# Patient Record
Sex: Male | Born: 1975
Health system: Southern US, Community
[De-identification: ages and names within clinical notes are randomized; demographics above are authoritative.]

## PROBLEM LIST (undated history)

## (undated) DIAGNOSIS — R001 Bradycardia, unspecified: Secondary | ICD-10-CM

## (undated) DIAGNOSIS — J45909 Unspecified asthma, uncomplicated: Secondary | ICD-10-CM

## (undated) DIAGNOSIS — F209 Schizophrenia, unspecified: Secondary | ICD-10-CM

## (undated) HISTORY — DX: Unspecified asthma, uncomplicated: J45.909

---

## 2000-06-28 ENCOUNTER — Emergency Department (HOSPITAL_COMMUNITY): Admission: EM | Admit: 2000-06-28 | Discharge: 2000-06-28 | Payer: Self-pay | Admitting: Emergency Medicine

## 2005-03-29 ENCOUNTER — Ambulatory Visit: Payer: Self-pay | Admitting: Family Medicine

## 2009-11-03 ENCOUNTER — Emergency Department (HOSPITAL_COMMUNITY): Admission: EM | Admit: 2009-11-03 | Discharge: 2009-11-03 | Payer: Self-pay | Admitting: Emergency Medicine

## 2009-11-09 ENCOUNTER — Ambulatory Visit (HOSPITAL_COMMUNITY): Admission: RE | Admit: 2009-11-09 | Discharge: 2009-11-09 | Payer: Self-pay | Admitting: Psychiatry

## 2010-10-02 LAB — ETHANOL: Alcohol, Ethyl (B): <5 mg/dL (ref 0–10)

## 2010-10-02 LAB — BASIC METABOLIC PANEL
BUN: 18 mg/dL (ref 6–23)
Creatinine, Ser: 1.01 mg/dL (ref 0.4–1.5)
GFR calc non Af Amer: >60 mL/min (ref >60)
Potassium: 4.2 mEq/L (ref 3.5–5.1)

## 2010-10-02 LAB — CBC
Platelets: 218 10*3/uL (ref 150–400)
WBC: 12.5 10*3/uL — ABNORMAL HIGH (ref 4.0–10.5)

## 2010-10-02 LAB — DIFFERENTIAL
Lymphocytes Relative: 30 % (ref 12–46)
Lymphs Abs: 3.7 10*3/uL (ref 0.7–4.0)
Neutro Abs: 7.6 10*3/uL (ref 1.7–7.7)
Neutrophils Relative %: 61 % (ref 43–77)

## 2010-10-02 LAB — RAPID URINE DRUG SCREEN, HOSP PERFORMED
Benzodiazepines: NOT DETECTED
Cocaine: NOT DETECTED
Tetrahydrocannabinol: NOT DETECTED

## 2010-12-07 ENCOUNTER — Emergency Department (HOSPITAL_COMMUNITY)
Admission: EM | Admit: 2010-12-07 | Discharge: 2010-12-08 | Disposition: A | Discharge status: Other Acute Inpatient Hospital | Payer: Medicaid Other | Attending: Emergency Medicine | Admitting: Emergency Medicine

## 2010-12-07 DIAGNOSIS — F29 Unspecified psychosis not due to a substance or known physiological condition: Secondary | ICD-10-CM | POA: Insufficient documentation

## 2010-12-08 ENCOUNTER — Inpatient Hospital Stay (HOSPITAL_COMMUNITY)
Admission: EM | Admit: 2010-12-08 | Discharge: 2010-12-19 | DRG: 885 | Disposition: A | Discharge status: Home / Self Care | Payer: PRIVATE HEALTH INSURANCE | Source: Other Acute Inpatient Hospital | Attending: Psychiatry | Admitting: Psychiatry

## 2010-12-08 DIAGNOSIS — F259 Schizoaffective disorder, unspecified: Principal | ICD-10-CM | POA: Diagnosis present

## 2010-12-08 DIAGNOSIS — Z5900 Homelessness unspecified: Secondary | ICD-10-CM

## 2010-12-08 DIAGNOSIS — R070 Pain in throat: Secondary | ICD-10-CM | POA: Diagnosis present

## 2010-12-08 LAB — RAPID URINE DRUG SCREEN, HOSP PERFORMED
Amphetamines: NOT DETECTED
Barbiturates: NOT DETECTED
Benzodiazepines: NOT DETECTED

## 2010-12-08 LAB — BASIC METABOLIC PANEL
BUN: 25 mg/dL — ABNORMAL HIGH (ref 6–23)
CO2: 30 mEq/L (ref 19–32)
Calcium: 10.5 mg/dL (ref 8.4–10.5)
Creatinine, Ser: 1.1 mg/dL (ref 0.4–1.5)
GFR calc Af Amer: >60 mL/min (ref >60)

## 2010-12-08 LAB — CBC
Hemoglobin: 14.9 g/dL (ref 13.0–17.0)
MCH: 29.6 pg (ref 26.0–34.0)
MCV: 87.5 fL (ref 78.0–100.0)
RBC: 5.04 MIL/uL (ref 4.22–5.81)

## 2010-12-08 LAB — DIFFERENTIAL
Lymphs Abs: 2.4 10*3/uL (ref 0.7–4.0)
Monocytes Relative: 8 % (ref 3–12)
Neutro Abs: 5.8 10*3/uL (ref 1.7–7.7)
Neutrophils Relative %: 64 % (ref 43–77)

## 2010-12-08 LAB — ETHANOL: Alcohol, Ethyl (B): <11 mg/dL — ABNORMAL HIGH (ref 0–10)

## 2010-12-14 DIAGNOSIS — F259 Schizoaffective disorder, unspecified: Secondary | ICD-10-CM

## 2010-12-17 LAB — COMPREHENSIVE METABOLIC PANEL
ALT: 37 U/L (ref 0–53)
AST: 23 U/L (ref 0–37)
Albumin: 3.9 g/dL (ref 3.5–5.2)
Alkaline Phosphatase: 92 U/L (ref 39–117)
BUN: 10 mg/dL (ref 6–23)
CO2: 31 mEq/L (ref 19–32)
Calcium: 9.7 mg/dL (ref 8.4–10.5)
Chloride: 102 mEq/L (ref 96–112)
Creatinine, Ser: 0.81 mg/dL (ref 0.4–1.5)
GFR calc Af Amer: >60 mL/min (ref >60)
GFR calc non Af Amer: >60 mL/min (ref >60)
Glucose, Bld: 82 mg/dL (ref 70–99)
Potassium: 4.2 mEq/L (ref 3.5–5.1)
Sodium: 140 mEq/L (ref 135–145)
Total Bilirubin: 0.2 mg/dL — ABNORMAL LOW (ref 0.3–1.2)
Total Protein: 6.7 g/dL (ref 6.0–8.3)
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
## 2011-01-01 NOTE — H&P (Signed)
Gregory Jackson, Gregory Jackson NO.:  [id number]  MEDICAL RECORD NO.:  [id number]  LOCATION:  0406                          FACILITY:  BH  PHYSICIAN:  Eulogio Ditch, MD DATE OF BIRTH:  22-Mar-1976  DATE OF ADMISSION:  12/08/2010 DATE OF DISCHARGE:                      PSYCHIATRIC ADMISSION ASSESSMENT   This is an involuntary admission to the services of Dr. Rogers Blocker.  HISTORY OF PRESENT ILLNESS:  The commitment papers indicate that he had told his father he was going to do away with himself.  The father does not think he has been compliant with his meds.  The respondent looked like he had not been eating.  He was dehydrated, and they felt that he was a danger to himself.  Of note, he was just admitted to [facility] May 10 to May 16, although he cannot tell me why.  The discharge papers from [facility] indicate that he was to be seen at [facility] in [place] the following day, on the 17th, and discharge instructions had been given to his mother.  Apparently, he wandered off after that. Today he is minimally communicative  PAST PSYCHIATRIC HISTORY:  According to the information that we have been able to gather to date, he is currently a patient of [facility], and he has had the 1 admission at [facility] that was already commented on.  SOCIAL HISTORY:  He was a high school graduate in 1995.  He has never married.  He has no children.  He is not employed for the past year.  FAMILY HISTORY:  Negative.  ALCOHOL AND DRUG HISTORY:  He denies.  He was positive for opiates in his UDS.  It is unclear whether he just took that off the street or how he got that.  PRIMARY CARE PROVIDER:  He does not have one.  MEDICAL PROBLEMS:  None are known.  MEDICATIONS:  It appears that he is prescribed by Dr. Betti Cruz.  These would have been his discharge meds. 1. Celexa 20 mg p.o. daily. 2. Trazodone 100 mg at h.s. 3. Risperdal 1 mg a.m. and 1.5 mg p.m.  DRUG ALLERGIES:  No  known drug allergies.  POSITIVE PHYSICAL FINDINGS:  He was medically cleared in the ED at [facility].  He was afebrile.  His temperature was 97.3 to 98.4.  His pulse was 66-68.  Respirations were 18-20.  Blood pressure was 108/56 to 116/78.  As already stated, his UDS was positive for opiates.  He had no abnormalities of CBC.  His BUN was slightly elevated at 26.  His UA was within normal limits.  Today, he is complaining about a sore throat.  We will get a culture and sensitivity.  MENTAL STATUS EXAM:  He was drowsy.  He was seen in his room in bed. He, however, could be easily aroused.  He seemed to be oriented.  His speech had normal rate, rhythm and tone.  His mood was depressed.  His affect was congruent.  Thought processes were somewhat clear, rational and goal-oriented.  He thinks he can go stay with his mother at discharge.  Judgment and insight were fair.  Concentration and memory were superficially intact.  Intelligence is average.  He denied being actively  suicidal or homicidal.  He denied any auditory or visual hallucinations.  He stated that because he got surrounded by people, he threatened to hurt himself.  Axis I:  Schizoaffective disorder, depressed type. Axis II:  Deferred. Axis III:  None known. Axis IV:  Severe. AXIS V:  27.  PLAN:  Admit for safety and stabilization.  We will restart his meds for compliance purposes.  We will check with [facility] regarding followup and case management.  We will check into placement.  Estimated length of stay is 3-5 days.     Mickie Leonarda Salon, P.A.-C.   ______________________________ Eulogio Ditch, MD    MD/MEDQ  D:  12/08/2010  T:  12/08/2010  Job:  [id number]  Electronically Signed by Jaci Lazier ADAMS P.A.-C. on 12/27/2010 07:34:38 PM Electronically Signed by Eulogio Ditch  on 01/01/2011 09:55:02 AM

## 2011-01-22 ENCOUNTER — Inpatient Hospital Stay (HOSPITAL_COMMUNITY)
Admission: AD | Admit: 2011-01-22 | Discharge: 2011-02-06 | DRG: 885 | Disposition: A | Discharge status: Home / Self Care | Payer: 59 | Attending: Psychiatry | Admitting: Psychiatry

## 2011-01-22 ENCOUNTER — Emergency Department (HOSPITAL_COMMUNITY)
Admission: EM | Admit: 2011-01-22 | Discharge: 2011-01-22 | Disposition: A | Discharge status: Other Acute Inpatient Hospital | Payer: Medicaid Other | Attending: Emergency Medicine | Admitting: Emergency Medicine

## 2011-01-22 DIAGNOSIS — F29 Unspecified psychosis not due to a substance or known physiological condition: Secondary | ICD-10-CM

## 2011-01-22 DIAGNOSIS — F259 Schizoaffective disorder, unspecified: Principal | ICD-10-CM

## 2011-01-22 DIAGNOSIS — Z9119 Patient's noncompliance with other medical treatment and regimen: Secondary | ICD-10-CM

## 2011-01-22 DIAGNOSIS — Z91199 Patient's noncompliance with other medical treatment and regimen due to unspecified reason: Secondary | ICD-10-CM

## 2011-01-22 HISTORY — DX: Schizophrenia, unspecified: F20.9

## 2011-01-22 LAB — COMPREHENSIVE METABOLIC PANEL
ALT: 21 U/L (ref 0–53)
Alkaline Phosphatase: 105 U/L (ref 39–117)
BUN: 28 mg/dL — ABNORMAL HIGH (ref 6–23)
CO2: 30 mEq/L (ref 19–32)
Chloride: 107 mEq/L (ref 96–112)
Creatinine, Ser: 0.97 mg/dL (ref 0.50–1.35)
GFR calc Af Amer: >60 mL/min (ref >60)
Glucose, Bld: 86 mg/dL (ref 70–99)
Potassium: 4.6 mEq/L (ref 3.5–5.1)
Sodium: 144 mEq/L (ref 135–145)
Total Bilirubin: 0.6 mg/dL (ref 0.3–1.2)
Total Protein: 8 g/dL (ref 6.0–8.3)

## 2011-01-22 LAB — CBC
HCT: 47.8 % (ref 39.0–52.0)
Hemoglobin: 16.6 g/dL (ref 13.0–17.0)
MCHC: 34.7 g/dL (ref 30.0–36.0)
RBC: 5.45 MIL/uL (ref 4.22–5.81)
WBC: 6.9 10*3/uL (ref 4.0–10.5)

## 2011-01-22 LAB — RAPID URINE DRUG SCREEN, HOSP PERFORMED: Barbiturates: NOT DETECTED

## 2011-01-22 LAB — ETHANOL: Alcohol, Ethyl (B): <11 mg/dL (ref 0–11)

## 2011-01-22 MED ORDER — ACETAMINOPHEN 325 MG PO TABS: 650.0000 mg | ORAL_TABLET | ORAL | Status: DC | PRN

## 2011-01-22 MED ORDER — ONDANSETRON HCL 4 MG PO TABS: 4.0000 mg | ORAL_TABLET | Freq: Three times a day (TID) | ORAL | Status: DC | PRN

## 2011-01-22 MED ORDER — LORAZEPAM 1 MG PO TABS: 1.0000 mg | ORAL_TABLET | Freq: Three times a day (TID) | ORAL | Status: DC | PRN

## 2011-01-22 NOTE — ED Notes (Signed)
Sitter at bedside. Pt given blanket. NAD noted at this time.
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
## 2011-01-23 DIAGNOSIS — F200 Paranoid schizophrenia: Secondary | ICD-10-CM

## 2011-01-24 LAB — BASIC METABOLIC PANEL
CO2: 33 mEq/L — ABNORMAL HIGH (ref 19–32)
Calcium: 9.5 mg/dL (ref 8.4–10.5)
Creatinine, Ser: 0.83 mg/dL (ref 0.50–1.35)
GFR calc non Af Amer: >60 mL/min (ref >60)

## 2011-01-24 NOTE — Assessment & Plan Note (Signed)
Gregory Jackson, KIMBERLEY NO.:  [id number]  MEDICAL RECORD NO.:  [id number]  LOCATION:  0402                          FACILITY:  [facility]  PHYSICIAN:  Eulogio Ditch, MD DATE OF BIRTH:  Nov 12, 1975  DATE OF ADMISSION:  01/22/2011 DATE OF DISCHARGE:                      PSYCHIATRIC ADMISSION ASSESSMENT   DATE OF ASSESSMENT:  January 23, 2011, at 1500.  IDENTIFYING INFORMATION:  This is a 35 year old, African American male, single.  This is an involuntary admission.  HISTORY OF THE PRESENT ILLNESS:  This is the second or third [facility] admission for Gregory Jackson, a pleasant African American male with a history of psychosis who had wandered away from his father's home and had been missing for at least a day and was found in the next county sleeping on a park bench.  He was brought to the emergency room by law enforcement. He was cooperative.  He had been off medications for an unknown period of time.  He was placed under petition by his father who was concerned about his safety.  PAST PSYCHIATRIC HISTORY:  History of previous [facility] admission from May 26th to June 6th, 2012, and was discharged at that time on Risperdal 1 mg in the morning and 1.5 mg in the p.m., Celexa 20 mg daily and trazodone 100 mg at night.  He also has a history of a previous admission to [place], [place], hospital x1.  No known history of substance abuse.  He has a history of schizoaffective disorder, depressed type.  SOCIAL HISTORY:  This is a single [place] American male who resides with his father in [place], [place], in [place].  He is followed as an outpatient by [facility] and reports that he has been taking his medications regularly, although he is an unreliable historian.  He said that he disagrees with his father who had been holding onto his medications for reasons that are unclear, and that he was in disagreement with this.  No known history of suicide attempts. He is a Engineer, structural.  Never married.  No children.  Currently residing with his father.  FAMILY HISTORY:  Not available.  ALCOHOL AND DRUG HISTORY:  No known history of substance abuse.  MEDICAL HISTORY: 1. Primary care provider:  None. 2. Medical problems:  None. 3. Current medications:  Probably Risperdal dose unknown, trazodone     100 mg 1 tablet daily at bedtime, Celexa 20 mg at night. 4. Drug allergies:  None.  PHYSICAL EXAM:  Done in the emergency room.  There was no physical basis noted for his complaints of feeling that someone was burning his legs. He has some old scarring there from superficial injuries but no signs of acute injury.  No rash.  Skin and lower extremities are in good condition.  Urine drug screen is negative for all substances. Chemistries reflect a BUN of 28, creatinine of 0.97, normal liver enzymes and a normal CBC.  MENTAL STATUS EXAM:  Fully alert male, cooperative, pleasant.  Greets me, remembers me from before but does have a guarded affect.  Appears internally distracted.  Concentration, insight and judgment all significantly impaired.  He is directable, nonaggressive.  Speech is non- pressured.  Often  has to have basic questions repeated, derailing, is obviously distracted.  Denies any suicidal or homicidal thoughts.  Has been willing to take medications.  Axis I:  Schizoaffective disorder, depressed type by history. Axis II:  No diagnosis. Axis III:  No diagnosis. Axis IV:  Deferred. Axis V:  Current 30.  Past year not known.  PLAN:  Involuntarily admit him to our acute stabilization unit.  We are going to start him on Risperdal 3 mg p.o. q.h.s., Ativan 1 mg q.6 hours p.r.n. for anxiety p.o. or IM.  Will recheck a BMET, given his mildly elevated BUN.  We will also check basic salicylate and acetaminophen levels and hear from his father about his concerns.     Margaret A. Lorin Picket, N.P.   ______________________________ Eulogio Ditch,  MD    MAS/MEDQ  D:  01/23/2011  T:  01/23/2011  Job:  [id number]  Electronically Signed by Kari Baars N.P. on 01/24/2011 07:48:11 AM Electronically Signed by Eulogio Ditch  on 01/24/2011 03:33:10 PM

## 2011-02-11 NOTE — Discharge Summary (Signed)
  Gregory Jackson, Gregory Jackson NO.:  [id number]  MEDICAL RECORD NO.:  [id number]  LOCATION:  0402                          FACILITY:  BH  PHYSICIAN:  Eulogio Ditch, MD DATE OF BIRTH:  Jul 25, 1975  DATE OF ADMISSION:  01/22/2011 DATE OF DISCHARGE:  02/06/2011                              DISCHARGE SUMMARY   HISTORY OF PRESENT ILLNESS:  Please refer to the initial psych assessment for details regarding the admission.  Briefly, a 35 year old African American male who was admitted on IVC papers because he was wandering after leaving his father's home.  The patient was also noncompliant with his medication.  HOSPITAL COURSE:  During stay at the hospital, the patient was started on Haldol.  The patient was slowly increased to 10 mg per day.  The patient responded well to the medication without any side effect.  The patient was also given Cogentin 0.5 mg twice a day to prevent any side effects from Haldol like EPS.  On January 31, 2011 the patient agreed to take Haldol decanoate and he was given Haldol decanoate 100 mg IM on January 31, 2011.  The patient's family was involved in the treatment and his father was okay with the discharge plan and he will be discharged back to the father and he will be followed by the ACT team and he will be on IM injection of Haldol every month.  MENTAL STATUS EXAM:  At the time of discharge, the patient is calm, cooperative during the interview, fair eye contact.  No abnormal movements noticed, not hallucinating, not delusional, not suicidal or homicidal, alert, awake, oriented x3.  Memory immediate, recent and remote fair.  Attention and concentration fair.  Abstraction ability fair.  Insight and judgment improved.  MEDICATIONS AT THE TIME OF DISCHARGE: 1. Haldol decanoate first given on July 19 at 100 mg which will be due     every 4 weeks. 2. Haldol 5 mg q.a.m. and nightly. 3. Cogentin 0.5 mg q.a.m. and nightly.  LABS:  Within  normal limit during the hospital stay.  DIAGNOSES:  AXIS I:  Schizoaffective disorder, mixed type. AXIS II:  Deferred. AXIS III:  No active medical issue. AXIS IV:  Chronic mental health issues. AXIS V:  50.  DISCHARGE FOLLOWUP:  The patient will follow up at the [facility], phone number [phone], appointment on 08/02 at 3:00 p.m.     Eulogio Ditch, MD     SA/MEDQ  D:  02/06/2011  T:  02/06/2011  Job:  [id number]  Electronically Signed by Eulogio Ditch  on 02/11/2011 05:07:27 PM

## 2011-12-24 ENCOUNTER — Emergency Department (HOSPITAL_COMMUNITY): Payer: Self-pay

## 2011-12-24 ENCOUNTER — Encounter (HOSPITAL_COMMUNITY): Payer: Self-pay

## 2011-12-24 ENCOUNTER — Emergency Department (HOSPITAL_COMMUNITY)
Admission: EM | Admit: 2011-12-24 | Discharge: 2011-12-26 | Disposition: A | Discharge status: Other Acute Inpatient Hospital | Payer: Self-pay

## 2011-12-24 DIAGNOSIS — S199XXA Unspecified injury of neck, initial encounter: Secondary | ICD-10-CM | POA: Insufficient documentation

## 2011-12-24 DIAGNOSIS — X58XXXA Exposure to other specified factors, initial encounter: Secondary | ICD-10-CM | POA: Insufficient documentation

## 2011-12-24 DIAGNOSIS — F22 Delusional disorders: Secondary | ICD-10-CM | POA: Insufficient documentation

## 2011-12-24 DIAGNOSIS — S0993XA Unspecified injury of face, initial encounter: Secondary | ICD-10-CM | POA: Insufficient documentation

## 2011-12-24 DIAGNOSIS — IMO0002 Reserved for concepts with insufficient information to code with codable children: Secondary | ICD-10-CM | POA: Insufficient documentation

## 2011-12-24 DIAGNOSIS — F29 Unspecified psychosis not due to a substance or known physiological condition: Secondary | ICD-10-CM | POA: Insufficient documentation

## 2011-12-24 DIAGNOSIS — R4182 Altered mental status, unspecified: Secondary | ICD-10-CM | POA: Insufficient documentation

## 2011-12-24 LAB — URINALYSIS, ROUTINE W REFLEX MICROSCOPIC
Leukocytes, UA: NEGATIVE
Protein, ur: NEGATIVE mg/dL
Urobilinogen, UA: 1 mg/dL (ref 0.0–1.0)

## 2011-12-24 LAB — BASIC METABOLIC PANEL
Calcium: 9.4 mg/dL (ref 8.4–10.5)
GFR calc non Af Amer: >90 mL/min (ref >90)
Sodium: 141 mEq/L (ref 135–145)

## 2011-12-24 LAB — CBC
MCV: 88.2 fL (ref 78.0–100.0)
Platelets: 280 10*3/uL (ref 150–400)
RDW: 13.1 % (ref 11.5–15.5)
WBC: 10.8 10*3/uL — ABNORMAL HIGH (ref 4.0–10.5)

## 2011-12-24 LAB — DIFFERENTIAL
Basophils Absolute: 0 10*3/uL (ref 0.0–0.1)
Eosinophils Absolute: 0.1 10*3/uL (ref 0.0–0.7)
Eosinophils Relative: 1 % (ref 0–5)
Lymphocytes Relative: 17 % (ref 12–46)

## 2011-12-24 LAB — ETHANOL: Alcohol, Ethyl (B): <11 mg/dL (ref 0–11)

## 2011-12-24 MED ORDER — ONDANSETRON HCL 4 MG PO TABS: 4.0000 mg | ORAL_TABLET | Freq: Three times a day (TID) | ORAL | Status: DC | PRN

## 2011-12-24 MED ORDER — IBUPROFEN 600 MG PO TABS: 600.0000 mg | ORAL_TABLET | Freq: Three times a day (TID) | ORAL | Status: DC | PRN

## 2011-12-24 MED ORDER — ACETAMINOPHEN 325 MG PO TABS: 650.0000 mg | ORAL_TABLET | ORAL | Status: DC | PRN

## 2011-12-24 MED ORDER — LORAZEPAM 1 MG PO TABS: 1.0000 mg | ORAL_TABLET | Freq: Three times a day (TID) | ORAL | Status: DC | PRN

## 2011-12-24 MED ORDER — ZOLPIDEM TARTRATE 5 MG PO TABS
5.0000 mg | ORAL_TABLET | Freq: Every evening | ORAL | Status: DC | PRN
  Administered 2011-12-24 – 2011-12-25 (×2): 5 mg via ORAL
  Filled 2011-12-24 (×2): qty 1

## 2011-12-24 MED ORDER — OLANZAPINE 5 MG PO TABS
5.0000 mg | ORAL_TABLET | Freq: Two times a day (BID) | ORAL | Status: DC
  Administered 2011-12-24 – 2011-12-26 (×4): 5 mg via ORAL
  Filled 2011-12-24 (×4): qty 1

## 2011-12-24 MED ORDER — ALUM & MAG HYDROXIDE-SIMETH 200-200-20 MG/5ML PO SUSP: 30.0000 mL | ORAL | Status: DC | PRN

## 2011-12-24 MED ORDER — NICOTINE 21 MG/24HR TD PT24
21.0000 mg | MEDICATED_PATCH | Freq: Every day | TRANSDERMAL | Status: DC
  Administered 2011-12-25 – 2011-12-26 (×2): 21 mg via TRANSDERMAL
  Filled 2011-12-24 (×2): qty 1

## 2011-12-24 NOTE — ED Notes (Signed)
Security @ bedside
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
## 2011-12-24 NOTE — Progress Notes (Signed)
[facility] ED CM assisted ED SW with [facility] free clinic information No pcp listed no insurance coverage
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
## 2011-12-24 NOTE — ED Notes (Signed)
Tele Psych completed

## 2011-12-24 NOTE — ED Provider Notes (Signed)
BP 130/66  Pulse 67  Temp(Src) 97.6 F (36.4 C) (Oral)  Resp 16  SpO2 98%  D/W Telepsych. Pt psychotic. Med recs entered. Recommends admission to psych.  Forbes Cellar, MD 12/24/11 Paulo Fruit

## 2011-12-24 NOTE — ED Notes (Signed)
Pt placed in paper scrubs.
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
## 2011-12-25 NOTE — ED Provider Notes (Signed)
8:40 AM Filed Vitals:   12/25/11 0630  BP: 119/74  Pulse: 58  Temp: 97.6 F (36.4 C)  Resp: 18   Well appearing. Calm and cooperative. Will await placement.   Lyanne Co, MD 12/25/11 [phone]

## 2011-12-26 ENCOUNTER — Encounter (HOSPITAL_COMMUNITY): Payer: Self-pay

## 2011-12-26 ENCOUNTER — Inpatient Hospital Stay (HOSPITAL_COMMUNITY)
Admission: AD | Admit: 2011-12-26 | Discharge: 2012-01-02 | DRG: 885 | Disposition: A | Discharge status: Home / Self Care | Payer: 59 | Source: Ambulatory Visit | Attending: Psychiatry | Admitting: Psychiatry

## 2011-12-26 DIAGNOSIS — Z79899 Other long term (current) drug therapy: Secondary | ICD-10-CM

## 2011-12-26 DIAGNOSIS — F200 Paranoid schizophrenia: Principal | ICD-10-CM

## 2011-12-26 LAB — GLUCOSE, CAPILLARY: Glucose-Capillary: 118 mg/dL — ABNORMAL HIGH (ref 70–99)

## 2011-12-26 MED ORDER — OLANZAPINE 5 MG PO TABS
5.0000 mg | ORAL_TABLET | Freq: Two times a day (BID) | ORAL | Status: DC
  Administered 2011-12-27: 5 mg via ORAL
  Filled 2011-12-26 (×6): qty 1

## 2011-12-26 MED ORDER — NICOTINE 21 MG/24HR TD PT24
21.0000 mg | MEDICATED_PATCH | Freq: Every day | TRANSDERMAL | Status: DC
  Administered 2011-12-27 – 2012-01-02 (×7): 21 mg via TRANSDERMAL
  Filled 2011-12-26 (×10): qty 1

## 2011-12-26 MED ORDER — ACETAMINOPHEN 325 MG PO TABS: 650.0000 mg | ORAL_TABLET | Freq: Four times a day (QID) | ORAL | Status: DC | PRN

## 2011-12-26 MED ORDER — MAGNESIUM HYDROXIDE 400 MG/5ML PO SUSP: 30.0000 mL | Freq: Every day | ORAL | Status: DC | PRN

## 2011-12-26 MED ORDER — HYDROXYZINE HCL 50 MG PO TABS
50.0000 mg | ORAL_TABLET | Freq: Every evening | ORAL | Status: DC | PRN
  Filled 2011-12-26 (×5): qty 1

## 2011-12-26 MED ORDER — ALUM & MAG HYDROXIDE-SIMETH 200-200-20 MG/5ML PO SUSP: 30.0000 mL | ORAL | Status: DC | PRN

## 2011-12-26 MED ORDER — IBUPROFEN 600 MG PO TABS: 600.0000 mg | ORAL_TABLET | Freq: Three times a day (TID) | ORAL | Status: DC | PRN

## 2011-12-26 NOTE — ED Provider Notes (Signed)
BP 127/77  Pulse 60  Temp 97.4 F (36.3 C) (Oral)  Resp 18  SpO2 96%  Pt accepted at [facility]. Awaiting 400 hall bed. No acute issues overnight. Resting comfortably.     Loren Racer, MD 12/26/11 [phone]
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
## 2011-12-26 NOTE — [facility] Counselor (Signed)
PER ERIC K AT [facility], PT ACCEPTED [facility]UL[facility]READL[facility] [phone].

## 2011-12-26 NOTE — Progress Notes (Signed)
Relationships Group  Facilitated a group with Elige Radon McKibben, MS, LPCA, NCC. We encouraged group members to talk about struggles in their relationships with others, and we discussed strategies for managing difficulties in relationships. Group members were supportive of and respectful toward one another.  The patient did not talk during the group. However, he remained respectful and fairly attentive to other group members.  Evelene Croon MA, MED, LPCA, NCC

## 2011-12-27 DIAGNOSIS — F200 Paranoid schizophrenia: Principal | ICD-10-CM

## 2011-12-27 MED ORDER — HALOPERIDOL 5 MG PO TABS
5.0000 mg | ORAL_TABLET | ORAL | Status: DC
  Administered 2011-12-28 – 2012-01-02 (×11): 5 mg via ORAL
  Filled 2011-12-27 (×5): qty 1
  Filled 2011-12-27: qty 28
  Filled 2011-12-27 (×8): qty 1
  Filled 2011-12-27: qty 28

## 2011-12-27 MED ORDER — ACETAMINOPHEN 325 MG PO TABS: 650.0000 mg | ORAL_TABLET | Freq: Four times a day (QID) | ORAL | Status: DC | PRN

## 2011-12-27 MED ORDER — MAGNESIUM HYDROXIDE 400 MG/5ML PO SUSP: 30.0000 mL | Freq: Every day | ORAL | Status: DC | PRN

## 2011-12-27 MED ORDER — HALOPERIDOL 5 MG PO TABS
5.0000 mg | ORAL_TABLET | Freq: Two times a day (BID) | ORAL | Status: DC
  Administered 2011-12-27: 5 mg via ORAL
  Filled 2011-12-27 (×4): qty 1

## 2011-12-27 MED ORDER — TRAZODONE HCL 150 MG PO TABS
150.0000 mg | ORAL_TABLET | Freq: Every day | ORAL | Status: DC
  Administered 2011-12-27 – 2011-12-31 (×5): 150 mg via ORAL
  Filled 2011-12-27 (×7): qty 1

## 2011-12-27 MED ORDER — ALUM & MAG HYDROXIDE-SIMETH 200-200-20 MG/5ML PO SUSP: 30.0000 mL | ORAL | Status: DC | PRN

## 2011-12-27 MED ORDER — HALOPERIDOL LACTATE 5 MG/ML IJ SOLN
5.0000 mg | Freq: Two times a day (BID) | INTRAMUSCULAR | Status: DC
  Filled 2011-12-27 (×4): qty 1

## 2011-12-27 MED ORDER — HALOPERIDOL LACTATE 5 MG/ML IJ SOLN
5.0000 mg | INTRAMUSCULAR | Status: DC
  Filled 2011-12-27 (×7): qty 1
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
## 2011-12-27 NOTE — Tx Team (Signed)
Interdisciplinary Treatment Plan Update (Adult)  Date:  12/27/2011  Time Reviewed:  10:15AM-11:15AM  Progress in Treatment: Attending groups:  Yes, even though new Participating in groups: Yes    Taking medication as prescribed:    Yes, new Tolerating medication:   New Family/Significant other contact made:  Not yet Patient understands diagnosis:   No Discussing patient identified problems/goals with staff:   Yes Medical problems stabilized or resolved:   Yes Denies suicidal/homicidal ideation:  Yes Issues/concerns per patient self-inventory:   None Other:    New problem(s) identified: Yes, Describe:  refuses to go back to his home with father  Reason for Continuation of Hospitalization: Delusions  Hallucinations Medication stabilization  Interventions implemented related to continuation of hospitalization:  Medication monitoring and adjustment, safety checks Q15 min., suicide risk assessment, group therapy, psychoeducation, collateral contact, aftercare planning, ongoing physician assessments, medication education  Additional comments:  Not applicable  Estimated length of stay:  4-6 days  Discharge Plan:  Unknown re living situation, [facility] follows him at this time  New goal(s):  Not applicable  Review of initial/current patient goals per problem list:   1.  Goal(s):  Medication stabilization  Met:  No  Target date:  By Discharge   As evidenced by:  New  2.  Goal(s):  Reduce psychotic symptoms to baseline (auditory hallucinations, delusions)  Met:  No  Target date:  By Discharge   As evidenced by:  Is having a voice, is delusional re voodoo curse on him  3.  Goal(s):  Refer for higher level of service than regular outpatient.  Met:  Yes  Target date:  By Discharge   As evidenced by:  Is already with [facility] from last stay  4.  Goal(s):  Deny SI/HI for 48 hours prior to D/C.  Met:  No  Target date:  By Discharge   As evidenced by:  New, but does  deny  5.  Goal(s):  Determine placement and aftercare at discharge.  Met:  No  Target date:  By Discharge   As evidenced by:  Do not know where he will live at D/C, wants an apartment, possibly in [place]   Attendees: Patient:  Gregory Jackson  12/27/2011 10:15AM-11:15AM  Family:     Physician:  Dr. Harvie Heck Readling 12/27/2011 10:15AM-11:15AM  Nursing:   Omelia Blackwater, RN 12/27/2011 10:15AM -11:15AM   Case Manager:  Ambrose Mantle, LCSW 12/27/2011 10:15AM-11:15AM  Counselor:  Veto Kemps, MT-BC 12/27/2011 10:15AM-11:15AM  Other:   Verne Spurr, PA 12/27/2011 10:15AM-11:15AM  Other:   Leighton Parody, RN 12/27/2011 11:17 AM   Other:   Shelda Jakes, RN 12/27/2011 11:17 AM   Other:       Scribe for Treatment Team:   Sarina Ser, 12/27/2011, 10:15AM-11:15AM

## 2011-12-27 NOTE — Progress Notes (Signed)
Patient ID: Gregory Jackson, male   DOB: 1976-03-18, 36 y.o.   MRN: [id number] Pt with minimal interaction with staff/peers. Pt out in dayroom with no inappropriate behaviors noted. Pt compliant with medications, but declines to talk freely with RN. Pt guarded during assessment. Pt denies SI/HI or plans to harm himself at this time. Will continue to check patient Q 15 minutes for safety.
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
## 2011-12-27 NOTE — [facility] Suicide Risk Assessment (Signed)
Suicide Risk Assessment  Admission Assessment     Demographic factors:  Assessment Details Time of Assessment: Admission Information Obtained From: Patient Current Mental Status:    Loss Factors:  Loss Factors: Financial problems / change in socioeconomic status Historical Factors:    Risk Reduction Factors:     CLINICAL FACTORS:   Schizophrenia:   Less than 37 years old Paranoid or undifferentiated type Currently Psychotic Previous Psychiatric Diagnoses and Treatments Medical Diagnoses and Treatments/Surgeries  COGNITIVE FEATURES THAT CONTRIBUTE TO RISK:  Polarized thinking    Diagnosis:  Axis I: Schizophrenia - Paranoid Type.   The patient was seen today and reports the following:   ADL's: Intact.  Sleep: The patient reports to sleeping well last night without difficulty.  Appetite: The patient reports a good appetite today.   Mild>(1-10) >Severe  Hopelessness (1-10): 0  Depression (1-10): 0  Anxiety (1-10): 0   Suicidal Ideation:  The patient denies any suicidal ideations today.  Plan: No  Intent: No  Means: No    Homicidal Ideation: The patient denies any homicidal ideations today.  Plan: No  Intent: No.  Means: No   General Appearance/Behavior: The patient was cooperative today with this provider.  Eye Contact: Good.  Speech: Appropriate in rate but with decreased volume with no pressuring of speech noted today.  Motor Behavior: wnl.  Level of Consciousness: Alert and Oriented x 3.  Mental Status: Alert and Oriented x 3.  Mood: Moderately Depressed.  Affect: Moderately Constricted.  Anxiety Level: No anxiety reported today.  Thought Process: The patient denies any visual hallucinations today.  He reports auditory hallucinations as well as paranoid delusions as described below.  Thought Content: The patient denies any visual hallucinations today.  He reports auditory hallucinations as well as paranoid delusions as described below.   Perception:  The  patient denies any visual hallucinations today.  He reports auditory hallucinations as well as paranoid delusions as described below.  Judgment: Fair to Poor.  Insight: Fair to Poor.  Cognition: Oriented to person, place and time.   Lab Results: No results found for this or any previous visit (from the past 48 hour(s)).   Review of Systems:  Neurological: No headaches, seizures or dizziness reported.  G.I.: The patient denies any constipation or stomach upset today.  Musculoskeletal: The patient denies any musculoskeletal issues today.   Time was spent today discussing with the patient her current symptoms. The patient reports to sleeping well and reports a good appetite.  He denies any significant feeling of sadness, anhedonia or depressed mood and denies any suicidal or homicidal ideations.  He reports to hearing voices but cannot provide specific information concerning the content.  He however reports delusional thinking that he has been placed under a "voodoo spell" which is bringing him bad luck.  The patient is followed by the [facility] ACT team and appears to have been off his medications.  Current Medications:    . haloperidol  5 mg Oral BH-qamhs   Or  . haloperidol lactate  5 mg Intramuscular BH-qamhs  . nicotine  21 mg Transdermal Q0600  . traZODone  150 mg Oral QHS  . DISCONTD: haloperidol  5 mg Oral BID  . DISCONTD: haloperidol lactate  5 mg Intramuscular BID  . DISCONTD: hydrOXYzine  50 mg Oral QHS,MR X 1  . DISCONTD: OLANZapine  5 mg Oral BID   Treatment Plan Summary:  1. Daily contact with patient to assess and evaluate symptoms and progress in treatment.  2. Medication management  3. The patient will deny suicidal ideations or homicidal ideations for 48 hours prior to discharge and have a depression and anxiety rating of 3 or less. The patient will also deny any auditory or visual hallucinations or delusional thinking.  4. The patient will deny any symptoms of substance  withdrawal at time of discharge.   Plan:  1. Will restart the medication Haldol at 5 mgs po q am and hs to address the patients psychosis and to provide more additional mood stabilization.  2. Will restart the medication Trazodone at 150 mgs po qhs for sleep.  3. Laboratory Studies reviewed.  4. Will order a TSH, Free T3 and Free T4 to evaluate the patient's thyroid functioning. 5. Will continue to monitor.   SUICIDE RISK:   Minimal: No identifiable suicidal ideation.  Patients presenting with no risk factors but with morbid ruminations; may be classified as minimal risk based on the severity of the depressive symptoms   Gregory Jackson 12/27/2011, 5:57 PM
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
## 2011-12-27 NOTE — Progress Notes (Signed)
Gregory Jackson is seen out in the milieu.He remains  guarded and avoids eye contact. HE attends his meals in the cafe'.  He has slept in between his meals and his groups today and is started on haldol ( for auditory hallucinations). POC contd PD RN [facility]

## 2011-12-27 NOTE — Progress Notes (Signed)
Pt. Self-inventory shows: sleep-"fair"; appetite-"good"; attention-"good"; depression and hopelessness as "1/10"; denies lethality and plans to "get out (of) town then I take better care myself". 15:00--Pt is currently in bed asleep, since shortly after lunch.

## 2011-12-27 NOTE — Progress Notes (Signed)
12/27/2011         Time: 0930      Group Topic/Focus: The focus of the group is on enhancing the patients' ability to cope with stressors by understanding what coping is, why it is important, the negative effects of stress and developing healthier coping skills. Patients practice [place] and discuss how exercise can be used as a healthy coping strategy.   Participation Level: Minimal  Participation Quality: Redirectable  Affect: Blunted  Cognitive: Hallucinating and Alert  Additional Comments: Patient observed to be responding to internal stimuli the first part of group, but participated fully in the second half with no apparent limitations.     [place] 12/27/2011 10:09 AM

## 2011-12-28 LAB — T4, FREE: Free T4: 1.42 ng/dL (ref 0.80–1.80)

## 2011-12-28 NOTE — Progress Notes (Signed)
[facility] Group Notes:  (Counselor/Nursing/MHT/Case Management/Adjunct)  12/28/2011 11 AM  Type of Therapy:  Aftercare Planning, Group Therapy, Dance/Movement Therapy   Participation Level:  Did not attend   Akire Rennert  

## 2011-12-28 NOTE — Progress Notes (Signed)
Patient ID: Gregory Jackson, male   DOB: 08-Dec-1975, 36 y.o.   MRN: [id number]  [facility] Group Notes:  (Counselor/Nursing/MHT/Case Management/Adjunct)  12/28/2011 11 AM  Type of Therapy:  Aftercare Planning, Group Therapy, Dance/Movement Therapy   Participation Level:  Did Not Attend  Rhunette Croft
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
## 2011-12-28 NOTE — Progress Notes (Signed)
D  Pt has isolated to his room all day except he has gone to the cafeteria for meals   He has had minimal to no interaction with others   He is slow to respond and appears to be thought blocking  He does appear to be responding to internal stimuli and is guarded   He reluctantly admitted to being paranoid and thinks people have put  voodoo spells on him prior to becoming hospitalized  He takes his medications but does not participate in unit activities   A  Verbal support given   Encourage more visibility on the unit and increased socialization   Allow extra time for pt response to questions and requests  Medications administered and effectiveness monitored  Q 15 min checks  R  Pt safe at present

## 2011-12-29 NOTE — Progress Notes (Signed)
D  Pt mostly isolates in his room and does not attend groups   He has minimal if any interaction with staff or peers   He does go to meals in the cafeteria  He continues to be thought blocking and slow to answer questions  He has improved his hygiene  He has on clean clothes and has no body odor  He managed to sit in the dayroom for about 30 minutes after lunch and was attempting to put a puzzle together   He was having a difficult time and believe he got frustrated and walked out back to his room   He is compliant with medications and does not request extra PRN's A    Verbal support given  Medications administered and effectiveness monitored   Continue to encourage group attendance and increased socialization  Praise efforts  Q 15 min checks R  Pt safe at present

## 2011-12-29 NOTE — Progress Notes (Signed)
Patient ID: Gregory Jackson, male   DOB: [birth year], 36 y.o.   MRN: [id number]   [facility] Group Notes:  (Counselor/Nursing/MHT/Case Management/Adjunct)  12/29/2011 11 AM  Type of Therapy:  Aftercare Planning, Group Therapy, Dance/Movement Therapy   Participation Level:  Did Not Attend  Cassidi Long

## 2011-12-29 NOTE — Progress Notes (Signed)
3  [facility] MD Progress Note                                         12/29/2011    MALYK GIROUARD September 20, 1975    [id number] Hospital day #3  Dx: Schizophrenia, paranoid type The patient was seen today and reports the following:  Sleep: good Appetite: "about 1/2 of what it should be."  Mild>(1-10) >Severe  Hopelessness (1-10):0 Depression (1-10): 0 Anxiety (1-10): 0 Suicidal Ideation: The patient denies sucidality. Plan: None Intent: None Means:  None Homicidal Ideation: The patient denies homicidality Plan: None Intent: None Means: None  Eye Contact: Good.  General Appearance/Behavior: cooperative Motor Behavior: normal Speech: The patient has a speech impediment, but is more understandable upon increasing his volume.  Mental Status: oriented x 3 Level of Consciousness:  alert Mood: depressed Affect: congruent  Thought Process: denies AH/VH Thought Content:  Denies AH/VH Perception: intact  Judgment: fair Insight: fair Cognition: at least average  Sleep: Number of Hours:   Vitals:  height is 6\' 4"  (1.93 m) and weight is 91.173 kg (201 lb). His oral temperature is 97.9 F (36.6 C). His blood pressure is 117/82 and his pulse is 109. His respiration is 16.      . haloperidol  5 mg Oral BH-qamhs   Or  . haloperidol lactate  5 mg    Intramuscular BH-qamhs  . nicotine  21 mg Transdermal Q0600  . traZODone  150 mg Oral QHS  . DISCONTD: haloperidol  5 mg Oral BID  . DISCONTD: haloperidol lactate  5 mg Intramuscular BID  . DISCONTD: hydrOXYzine  50 mg Oral QHS,MR X 1    Results for orders placed during the hospital encounter of 12/26/11 (from the past 48 hour(s))  GLUCOSE, CAPILLARY     Status: Abnormal   Collection Time   12/26/11 10:44 PM      Component Value Range Comment   Glucose-Capillary 118 (*) 70 - 99 mg/dL   TSH     Status: Normal   Collection Time   12/27/11  7:39 PM      Component Value Range Comment   TSH 3.680  0.350 - 4.500 uIU/mL   T3,  FREE     Status: Normal   Collection Time   12/27/11  7:39 PM      Component Value Range Comment   T3, Free 3.9  2.3 - 4.2 pg/mL   T4, FREE     Status: Normal   Collection Time   12/27/11  7:39 PM      Component Value Range Comment   Free T4 1.42  0.80 - 1.80 ng/dL     ROS:    Constitutional: WDWN AAF NAD   GI: Negative for N,V,D,C   Neuro: Negative for dizziness, blurred vision, visual changes, headaches   Resp: Negative for wheezing, SOB, cough   Cardio: Negative for CP, diaphoresis, fatigue   MSK: Negative for joint pain, swelling, DROM, or ambulatory difficulties.  Time was spent with the patient today discussing his symptoms. Ragan looks a little more focused today.  Voices that he sometimes having trouble thinking clearly, but the medicine does make him think a little clearer.  He begrudgingly admits he is feeling a  Bit better.   Treatment Plan Summary:  1. Daily contact with patient to assess and evaluate symptoms and progress in treatment.  2. Medication  management  3. The patient will deny suicidal ideations or homicidal ideations for 48 hours prior to discharge and have a depression and anxiety rating of 3 or less. The patient will also deny any auditory or visual hallucinations or delusional thinking.  4. The patient will deny any symptoms of substance withdrawal at time of discharge.  Plan:  1. Will continue the Haldol at 5 mgs po q am and hs to address the patients psychosis and to provide more additional mood stabilization.  2. Will continue the Trazodone at 150 mgs po qhs for sleep.  3. Laboratory Studies reviewed.  4.The patient is encouraged to attend and participate in groups. 5. Will continue to monitor.   Rona Ravens. Pistol Kessenich [facility] 12/29/2011

## 2011-12-30 NOTE — Progress Notes (Signed)
Pt sitting in the corner in the dayroom watching TV.   He denies SI/HI at this time.  He voices no needs/concerns.  Conversation is minimal.  He denies AV hallucinations.  He is encouraged to make his needs known to staff.  Safety maintained with q15 minute checks.

## 2011-12-30 NOTE — Discharge Planning (Signed)
Met with patient in Aftercare Planning Group.   He is increasingly difficult to comprehend.  He said he wants to get his own apartment, and Case Manager pointed out that he cannot leave the hospital and sleep in a new apartment that night, so will need to figure out where to stay while looking for the apartment.  His response was, "I don't sleep that much anyway."  He initially stated he could possibly go stay with his father to allow his father time to help him get an apartment, then he later told other staff he refuses still to go with his father.    Left a message for [employer] (his ACT Team) asking for a call back.  Utilization review done for additional days.  Ambrose Mantle, LCSW 12/30/2011, 2:00 PM

## 2011-12-30 NOTE — Progress Notes (Signed)
[facility] MD Progress Note  12/30/2011 4:13 PM  Diagnosis:  Axis I: Schizophrenia - Paranoid Type.   The patient was seen today and reports the following:   ADL's: Intact.  Sleep: The patient reports to sleeping well last night without difficulty.  Appetite: The patient reports a good appetite today.   Mild>(1-10) >Severe  Hopelessness (1-10): 0  Depression (1-10): 0  Anxiety (1-10): 0   Suicidal Ideation: The patient denies any suicidal ideations today.  Plan: No  Intent: No  Means: No   Homicidal Ideation: The patient denies any homicidal ideations today.  Plan: No  Intent: No.  Means: No   General Appearance/Behavior: The patient was cooperative today with this provider.  Eye Contact: Good.  Speech: Appropriate in rate but with decreased volume with no pressuring of speech noted today.  The patient's speech was difficult to understand. Motor Behavior: wnl.  Level of Consciousness: Alert and Oriented x 3.  Mental Status: Alert and Oriented x 3.  Mood: Appears moderately depressed.  Affect: Appears moderately constricted.  Anxiety Level: No anxiety reported today.  Thought Process: The patient denies any auditory or visual hallucinations today. He reports a reduction in his paranoid delusions related to being under a Voodoo curse.  Thought Content: The patient denies any auditory or visual hallucinations today. He reports a reduction in his paranoid delusions related to being under a Voodoo curse.  Perception: The patient denies any auditory or visual hallucinations today. He reports a reduction in his paranoid delusions related to being under a Voodoo curse.  Judgment: Fair.  Insight: Fair.  Cognition: Oriented to person, place and time.  Sleep:  Number of Hours: 6.5    Vital Signs:Blood pressure 122/76, pulse 105, temperature 98.4 F (36.9 C), temperature source Oral, resp. rate 24, height 6\' 4"  (1.93 m), weight 91.173 kg (201 lb).  Current Medications: Current  Facility-Administered Medications  Medication Dose Route Frequency Provider Last Rate Last Dose  . acetaminophen (TYLENOL) tablet 650 mg  650 mg Oral Q6H PRN Verne Spurr, PA-C      . alum & mag hydroxide-simeth (MAALOX/MYLANTA) 200-200-20 MG/5ML suspension 30 mL  30 mL Oral Q4H PRN Mike Craze, MD      . haloperidol (HALDOL) tablet 5 mg  5 mg Oral BH-qamhs Curlene Labrum Darcee Dekker, MD   5 mg at 12/30/11 0841  . ibuprofen (ADVIL,MOTRIN) tablet 600 mg  600 mg Oral Q8H PRN Mike Craze, MD      . magnesium hydroxide (MILK OF MAGNESIA) suspension 30 mL  30 mL Oral Daily PRN Verne Spurr, PA-C      . nicotine (NICODERM CQ - dosed in mg/24 hours) patch 21 mg  21 mg Transdermal Q0600 Curlene Labrum Mileydi Milsap, MD   21 mg at 12/30/11 0656  . traZODone (DESYREL) tablet 150 mg  150 mg Oral QHS Curlene Labrum Trachelle Low, MD   150 mg at 12/29/11 2123  . DISCONTD: haloperidol lactate (HALDOL) injection 5 mg  5 mg Intramuscular BH-qamhs Curlene Labrum Abigaelle Verley, MD       Lab Results: No results found for this or any previous visit (from the past 48 hour(s)).  Review of Systems:  Neurological: No headaches, seizures or dizziness reported.  G.I.: The patient denies any constipation or stomach upset today.  Musculoskeletal: The patient denies any musculoskeletal issues today.   Time was spent today discussing with the patient his current symptoms. The patient reports to sleeping well and reports a good appetite. He denies any significant feeling  of sadness, anhedonia or depressed mood but appears moderately depressed.  He also denies any suicidal or homicidal ideations as well as any auditory or visual hallucinations.  The patient continues to state that hs is under a "voodoo spell" but feels the power of the spell is lessening.  The patient is followed by [facility] ACT team and denies any medication related side effects.  Treatment Plan Summary:  1. Daily contact with patient to assess and evaluate symptoms and progress in treatment.  2.  Medication management  3. The patient will deny suicidal ideations or homicidal ideations for 48 hours prior to discharge and have a depression and anxiety rating of 3 or less. The patient will also deny any auditory or visual hallucinations or delusional thinking.  4. The patient will deny any symptoms of substance withdrawal at time of discharge.   Plan:  1. Will continue the medication Haldol at 5 mgs po q am and hs to continue to address the patients psychosis and to provide more additional mood stabilization.  2. Will continue the medication Trazodone at 150 mgs po qhs for sleep.  3. Laboratory Studies reviewed.  4. Will continue to monitor.    Alexsis Kathman 12/30/2011, 4:13 PM

## 2011-12-30 NOTE — Progress Notes (Signed)
Patient ID: Gregory Jackson, male   DOB: December 25, 1975, 36 y.o.   MRN: [id number] He has been up and about and to group. He denies seeing anything, has no c/o discomfort.

## 2011-12-30 NOTE — Progress Notes (Signed)
12/30/2011         Time: 0930      Group Topic/Focus: The focus of this group is on discussing various styles of communication and communicating assertively using 'I' (feeling) statements.   Participation Level: Minimal  Participation Quality: Drowsy  Affect: Blunted   Cognitive: Alert  Additional Comments: [name] speech appears more garbled today, participates with encouragement.   Corderro Koloski 12/30/2011 1:37 PM

## 2011-12-30 NOTE — Progress Notes (Signed)
Patient ID: Gregory Jackson, male   DOB: 1975-09-15, 36 y.o.   MRN: [id number] Has been isolative and quiet this evening, was staying in his room until encouraged to come out for group and stayed to watch part of the basketball game and had a snack.  Orlene Erm is minimal and is difficult to engage, but is polite and cooperative. Will continue to encourage him to participate more, will continue to try to develop  Therapeutic rapport, will continue to monitor.

## 2011-12-30 NOTE — Progress Notes (Signed)
[facility] Group Notes:  (Counselor/Nursing/MHT/Case Management/Adjunct)  12/30/2011 12:15 PM  Type of Therapy: Group Therapy   Participation Level: Minimal   Participation Quality: Minimal, drowsy  Affect: Blunted   Cognitive: oriented, alert   Insight: minimal  Engagement in Group: Limited  Modes of Intervention: Clarification, Education, Problem-solving, Socialization, Activity, Encouragement and Support   Summary of Progress/Problems: Pt participated in group by listening attentively and self disclosing.  Pt was drowsy. Therapist prompted clients to identify successful methods they had used to manage anger, depression, insomnia and voices. Pt stated that his throat was swollen.  Therapist encouraged Pt to inform the nurse after group.  Therapy was focused on developing positive coping skills and increasing self esteem. Therapist encouraged Pt to express feelings.  Therapist explained the affirmations exercise and Pt gave positive affirmations to another Pt.  Pt was open to feedback from others.  Therapist offered support and encouragement.  Slight progress noted.  Intervention effective.         Christen Butter 12/30/2011, 12:15 PM

## 2011-12-31 NOTE — Progress Notes (Signed)
Patient is visible on the unit today. He is going down for meals. Noted to have flat affect. Watching TV in the dayroom with peers this evening. Denies any complaints. Negative for SI/HI or AVH when asked by Clinical research associate. Very soft and brief responses to questions. Remains safe on the unit.

## 2011-12-31 NOTE — Progress Notes (Signed)
[facility] Gregory Jackson Progress Note  12/31/2011 5:45 PM  Diagnosis:  Axis I: Schizophrenia - Paranoid Type.   The patient was seen today and reports the following:   ADL's: Intact.  Sleep: The patient reports to continuing to sleep well last night without difficulty.  Appetite: The patient reports a good appetite today.   Mild>(1-10) >Severe  Hopelessness (1-10): 0  Depression (1-10): 0  Anxiety (1-10): 0   Suicidal Ideation:  The patient adamantly denies any suicidal ideations today.  Plan: No  Intent: No  Means: No   Homicidal Ideation: The patient adamantly denies any homicidal ideations today.  Plan: No  Intent: No.  Means: No   General Appearance/Behavior: The patient continued to be cooperative today with this provider.  Eye Contact: Good.  Speech:  Appropriate in rate but continued decreased volume with no pressuring of speech noted today. The patient's speech was more easily understood today.  Motor Behavior: wnl.  Level of Consciousness: Alert and Oriented x 3.  Mental Status: Alert and Oriented x 3.  Mood: Appears mild to moderately depressed.  Affect: Appears mild to moderately constricted.  Anxiety Level: No anxiety reported today.  Thought Process: The patient denies any auditory or visual hallucinations today. He also states that the Voodoo curse is no longer affecting him. Thought Content: The patient denies any auditory or visual hallucinations today.  He also states that the Voodoo curse is no longer affecting him. Perception: The patient denies any auditory or visual hallucinations today.  He also states that the Voodoo curse is no longer affecting him.  Judgment: Fair.  Insight: Fair.  Cognition: Oriented to person, place and time.  Sleep:  Number of Hours: 6.75    Vital Signs:Blood pressure 111/71, pulse 90, temperature 97.1 F (36.2 C), temperature source Oral, resp. rate 16, height 6\' 4"  (1.93 m), weight 91.173 kg (201 lb).  Current Medications: Current  Facility-Administered Medications  Medication Dose Route Frequency Provider Last Rate Last Dose  . acetaminophen (TYLENOL) tablet 650 mg  650 mg Oral Q6H PRN Gregory Spurr, Gregory Jackson      . alum & mag hydroxide-simeth (MAALOX/MYLANTA) 200-200-20 MG/5ML suspension 30 mL  30 mL Oral Q4H PRN Gregory Craze, Gregory Jackson      . haloperidol (HALDOL) tablet 5 mg  5 mg Oral BH-qamhs Gregory Labrum Sita Mangen, Gregory Jackson   5 mg at 12/31/11 0833  . ibuprofen (ADVIL,MOTRIN) tablet 600 mg  600 mg Oral Q8H PRN Gregory Craze, Gregory Jackson      . magnesium hydroxide (MILK OF MAGNESIA) suspension 30 mL  30 mL Oral Daily PRN Gregory Spurr, Gregory Jackson      . nicotine (NICODERM CQ - dosed in mg/24 hours) patch 21 mg  21 mg Transdermal Q0600 Gregory Labrum Keyvin Rison, Gregory Jackson   21 mg at 12/31/11 1610  . traZODone (DESYREL) tablet 150 mg  150 mg Oral QHS Gregory Labrum Kenyetta Fife, Gregory Jackson   150 mg at 12/30/11 2204   Lab Results: No results found for this or any previous visit (from the past 48 hour(s)).  Review of Systems:  Neurological: No headaches, seizures or dizziness reported.  G.I.: The patient denies any constipation or stomach upset today.  Musculoskeletal: The patient denies any musculoskeletal issues today.   Time was spent today discussing with the patient his current symptoms. The patient reports to continuing to sleep well without difficulty and reports a good appetite. He denies any significant feeling of sadness, anhedonia or depressed mood but appears mild to moderately depressed today.  When he was asked about this the patient reassured this provider that he was not depressed. He denies any suicidal or homicidal ideations today as well as any auditory or visual hallucinations. The patient denies any delusional thinking today and reports that the Voodoo curse is no longer affecting him.    Treatment Plan Summary:  1. Daily contact with patient to assess and evaluate symptoms and progress in treatment.  2. Medication management  3. The patient will deny suicidal ideations  or homicidal ideations for 48 hours prior to discharge and have a depression and anxiety rating of 3 or less. The patient will also deny any auditory or visual hallucinations or delusional thinking.  4. The patient will deny any symptoms of substance withdrawal at time of discharge.   Plan:  1. Will continue the medication Haldol at 5 mgs po q am and hs to continue to address the patients psychosis and to provide more additional mood stabilization.  2. Will continue the medication Trazodone at 150 mgs po qhs for sleep.  3. Laboratory Studies reviewed.  4. Will continue to monitor.  5. Possible discharge this week.  Gregory Jackson 12/31/2011, 5:45 PM

## 2011-12-31 NOTE — Tx Team (Signed)
Interdisciplinary Treatment Plan Update (Adult)  Date:  12/31/2011  Time Reviewed:  10:15AM-11:15AM  Progress in Treatment: Attending groups:  Yes Participating in groups:    Yes Taking medication as prescribed:    Yes Tolerating medication:   Yes Family/Significant other contact made:  Yes Patient understands diagnosis:   No Discussing patient identified problems/goals with staff:   Yes Medical problems stabilized or resolved:   Yes Denies suicidal/homicidal ideation:  Yes Issues/concerns per patient self-inventory:   None Other:    New problem(s) identified: No, Describe:    Reason for Continuation of Hospitalization: Delusions  Medication stabilization  Interventions implemented related to continuation of hospitalization:  Medication monitoring and adjustment, safety checks Q15 min., suicide risk assessment, group therapy, psychoeducation, collateral contact, aftercare planning, ongoing physician assessments, medication education  Additional comments:  Not applicable  Estimated length of stay:  1-2 days  Discharge Plan:  Return to live with father, then get his own apartment   New goal(s):  Not applicable  Review of initial/current patient goals per problem list:   1.  Goal(s):  Medication stabilization  Met:  No  Target date:  By Discharge   As evidenced by:  Almost stable  2.  Goal(s):  Reduce psychotic symptoms to baseline (auditory hallucinations, delusions)  Met:  No  Target date:  By Discharge   As evidenced by:  Getting close  3.  Goal(s):  Refer for higher level of service than regular outpatient.  Met:  Yes  Target date:  By Discharge   As evidenced by:  Referred back to [facility] for ACTT services  4.  Goal(s):  Deny SI/HI for 48 hours prior to D/C.  Met:  Yes  Target date:  By Discharge   As evidenced by:  Denies  5. Goal(s): Determine placement and aftercare at discharge.  Met: No  Target date: By Discharge  As evidenced by: Will live with  father again, is interested in ACTT services with [facility], being referred -- still have to find out if he can go ahead and start with them or if he needs to go to [facility] for awhile first   Attendees: Patient:  Gregory Jackson  12/31/2011 10:15AM-11:15AM  Family:     Physician:  Dr. Harvie Heck Readling 12/31/2011 10:15AM-11:15AM  Nursing:   Neill Loft, RN 12/31/2011 10:15AM -11:15AM   Case Manager:  Ambrose Mantle, LCSW 12/31/2011 10:15AM-11:15AM  Counselor:  Marni Griffon, LCAS 12/31/2011 10:15AM-11:15AM  Other:      Other:      Other:      Other:       Scribe for Treatment Team:   Sarina Ser, 12/31/2011, 10:15AM-11:15AM

## 2011-12-31 NOTE — Progress Notes (Signed)
Pt observed in his room awake, sitting on the bed, looking out the window.  He reports his day has been fine.  He tells this Clinical research associate that he does not believe that he is under a voodoo curse.  He denies SI/HI/AV at this time.  His speech is a little clearer today.  He voices no needs/concerns.  He is still quiet and withdrawn, but he was in the dayroom earlier with the other pts watching TV.  His plan is to return to live with his father until he can maybe get an apt.  Safety maintained with q15 minute checks.

## 2011-12-31 NOTE — Progress Notes (Signed)
[facility] Group Notes:  (Counselor/Nursing/MHT/Case Management/Adjunct)  12/31/2011 12:15 PM  Type of Therapy: Group Therapy   Participation Level: Minimal   Participation Quality: Minimal  Affect: Blunted   Cognitive: oriented, alert   Insight: minimal  Engagement in Group: Limited  Modes of Intervention: Clarification, Education, Problem-solving, Socialization, Activity, Encouragement and Support   Summary of Progress/Problems: Pt actively participated in group by listening attentively and self disclosing.  Therapist acknowledged one patient's birthday today and prompted clients to identify thing in their lives they can celebrate.  Pt fondly recalled a special birthday right before his grandmother died.  Therapy was focused on elevating mood and affirming self-worth. Therapist encouraged Pt to express feelings    Therapist offered support and encouragement.  Slight progress noted.  Intervention effective.         Christen Butter 12/31/2011, 12:15 PM

## 2011-12-31 NOTE — Discharge Planning (Signed)
Met with patient in Aftercare Planning Group as well as in Treatment Team.  He now believes that he may go stay with his father while he looks for an apartment.  When asked by Case Manager if there is somewhere he could live independently yet still be available to give and receive support from his family, he replied that the apartment he is interested in is about 1 mile from his family's homes.  Case Manager did referral to restart [facility] ACT Team.  Ambrose Mantle, LCSW 12/31/2011, 1:05 PM

## 2012-01-01 MED ORDER — TRAZODONE HCL 100 MG PO TABS
100.0000 mg | ORAL_TABLET | Freq: Every day | ORAL | Status: DC
  Administered 2012-01-01: 100 mg via ORAL
  Filled 2012-01-01: qty 1
  Filled 2012-01-01: qty 14
  Filled 2012-01-01: qty 1

## 2012-01-01 NOTE — Progress Notes (Signed)
[facility] MD Progress Note  01/01/2012 8:45 PM  Diagnosis:  Axis I: Schizophrenia - Paranoid Type.   The patient was seen today and reports the following:   ADL's: Intact.  Sleep: The patient reports to continuing to sleep well without difficulty but with some daytime sedation noted.  Appetite: The patient reports a good appetite today.   Mild>(1-10) >Severe  Hopelessness (1-10): 0  Depression (1-10): 0  Anxiety (1-10): 0   Suicidal Ideation: The patient adamantly denies any suicidal ideations today.  Plan: No  Intent: No  Means: No   Homicidal Ideation: The patient adamantly denies any homicidal ideations today.  Plan: No  Intent: No.  Means: No   General Appearance/Behavior: The patient continues to be cooperative today with this provider.  Eye Contact: Good.  Speech: Appropriate in rate but continued decreased volume with no pressuring of speech noted today.   Motor Behavior: wnl.  Level of Consciousness: Alert and Oriented x 3.  Mental Status: Alert and Oriented x 3.  Mood: Appears mildly depressed.  Affect: Appears mildly constricted.  Anxiety Level: No anxiety reported today.  Thought Process: The patient denies any auditory or visual hallucinations today. He also states that the Voodoo curse is no longer affecting him.  Thought Content: The patient denies any auditory or visual hallucinations today. He also states that the Voodoo curse is no longer affecting him.  Perception: The patient denies any auditory or visual hallucinations today. He also states that the Voodoo curse is no longer affecting him.  Judgment: Fair to Good.  Insight: Fair to Good.  Cognition: Oriented to person, place and time.  Sleep:  Number of Hours: 6.75    Vital Signs:Blood pressure 119/80, pulse 96, temperature 97.6 F (36.4 C), temperature source Oral, resp. rate 20, height 6\' 4"  (1.93 m), weight 91.173 kg (201 lb).  Current Medications: Current Facility-Administered Medications  Medication  Dose Route Frequency Provider Last Rate Last Dose  . acetaminophen (TYLENOL) tablet 650 mg  650 mg Oral Q6H PRN Verne Spurr, PA-C      . alum & mag hydroxide-simeth (MAALOX/MYLANTA) 200-200-20 MG/5ML suspension 30 mL  30 mL Oral Q4H PRN Mike Craze, MD      . haloperidol (HALDOL) tablet 5 mg  5 mg Oral BH-qamhs Curlene Labrum Natahsa Marian, MD   5 mg at 01/01/12 0732  . ibuprofen (ADVIL,MOTRIN) tablet 600 mg  600 mg Oral Q8H PRN Mike Craze, MD      . magnesium hydroxide (MILK OF MAGNESIA) suspension 30 mL  30 mL Oral Daily PRN Verne Spurr, PA-C      . nicotine (NICODERM CQ - dosed in mg/24 hours) patch 21 mg  21 mg Transdermal Q0600 Curlene Labrum Gregroy Dombkowski, MD   21 mg at 01/01/12 0612  . traZODone (DESYREL) tablet 100 mg  100 mg Oral QHS Ronny Bacon, MD      . DISCONTD: traZODone (DESYREL) tablet 150 mg  150 mg Oral QHS Curlene Labrum Sahib Pella, MD   150 mg at 12/31/11 2149   Lab Results: No results found for this or any previous visit (from the past 48 hour(s)).  Review of Systems:  Neurological: No headaches, seizures or dizziness reported.  G.I.: The patient denies any constipation or stomach upset today.  Musculoskeletal: The patient denies any musculoskeletal issues today.   Time was spent today discussing with the patient his current symptoms. The patient reports to continuing to sleep well without difficulty and reports a good appetite. He denies any  significant feeling of sadness, anhedonia or depressed mood but appears mildly depressed today.  He denies any suicidal or homicidal ideations today as well as any auditory or visual hallucinations. The patient denies any delusional thinking and reports that the Voodoo curse is no longer affecting him.   The patient states that his only concern is daytime sedation with his medications.  It was discussed with the patient the possibility of decreasing his bedtime Trazodone dosage to address this and he agreed.  Treatment Plan Summary:  1. Daily contact  with patient to assess and evaluate symptoms and progress in treatment.  2. Medication management  3. The patient will deny suicidal ideations or homicidal ideations for 48 hours prior to discharge and have a depression and anxiety rating of 3 or less. The patient will also deny any auditory or visual hallucinations or delusional thinking.  4. The patient will deny any symptoms of substance withdrawal at time of discharge.   Plan:  1. Will continue the medication Haldol at 5 mgs po q am and hs to continue to address the patients psychosis and to provide more additional mood stabilization.  2. Will decrease the medication Trazodone to 100 mgs po qhs for sleep and will decrease due to daytime sedation.  3. Laboratory Studies reviewed.  4. Will continue to monitor.  5. Possible discharge this week.  Tommie Bohlken 01/01/2012, 8:45 PM

## 2012-01-01 NOTE — Progress Notes (Signed)
Patient ID: Gregory Jackson, male   DOB: February 05, 1976, 36 y.o.   MRN: [id number] D.He has been up and in dayroom today. Has little communication with peers and staff. denies having any discomfort. Denies  A/V hallucinations.

## 2012-01-01 NOTE — Progress Notes (Signed)
I have left messages Monday, Tuesday and this morning for Pt's father to call.  He has not yet returned the call.

## 2012-01-01 NOTE — Progress Notes (Signed)
01/01/2012         Time: 0930      Group Topic/Focus: The focus of this group is on enhancing the patient's understanding of leisure, barriers to leisure, and the importance of engaging in positive leisure activities upon discharge for improved total health.   Participation Level: Active  Participation Quality: Redirectable  Affect: Blunted  Cognitive: Alert   Additional Comments: Patient sitting in the back of the group room, participating appropriately, but talking so quietly it was difficult for R.T. And others to know what he was saying. [name] responses were logical from what could be heard.    Gregory Jackson 01/01/2012 11:40 AM

## 2012-01-01 NOTE — Progress Notes (Signed)
[facility] Group Notes:  (Counselor/Nursing/MHT/Case Management/Adjunct)  01/01/2012 2:30 PM  Type of Therapy:  [employer] Support  Participation Level:  Minimal  Participation Quality:  Attentive and Sharing  Affect:  Blunted  Cognitive:  Drowsy, Oriented  Insight:  Limited  Engagement in Group:  Limited  Engagement in Therapy:  Limited  Modes of Intervention:  Clarification, Education, Socialization and Support  Summary of Progress/Problems:  Pt participated in this psycho/educational session by listening attentively. Therapist introduced the representative from the [employer].  He explained to the group that he was here to tell his story and inform them of the free services available at the [employer].  The speaker told his story and explained the progression of his Schizoaffective Disorder and Substance Abuse and the course of his recovery.  He entertained questions and handed out information cards.  Therapist encouraged patients to take advantage of these free services that would be an excellent addition to their support system.     Marni Griffon C 01/01/2012, 2:30 PM

## 2012-01-01 NOTE — Progress Notes (Signed)
Pt observed in his room sitting on the side of his bed.  He reports that his day has been ok.  He denies any delusions of being under a voodoo curse.  He denies SI/HI/AV.  He is slated to possibly be discharged tomorrow.  He voices no needs/concerns at this time.  Earlier his father, Gregory Jackson, came, thinking that pt was being discharged.  This Clinical research associate went out and spoke to father.  He gave another ph# to contact him as [phone].  Informed him that staff from [facility] would notify him when pt was being discharged.  Father voiced understanding.  Pt cooperative and participating in unit activities/groups.  Safety maintained with q15 minute checks.

## 2012-01-01 NOTE — Progress Notes (Signed)
[facility] Group Notes:  (Counselor/Nursing/MHT/Case Management/Adjunct)  01/01/2012 12:30 PM  Type of Therapy: Group Therapy   Participation Level: Minimal   Participation Quality:  Sharing, drowsy   Affect: Blunted  Cognitive:  Oriented,   Insight: Poor  Engagement in Group: Limited  Modes of Intervention: Clarification, Education, Problem-solving, Socialization and Support   Summary of Progress/Problems: Pt participated in group by listening attentively and self disclosing.  Therapist prompted Pts to report on progress with goals.  Pt acknowledged some progress.  Therapist prompted Pt to express feelings openly,  identify and work on the underlying issue.  Pt explained that he had talked with his father at 6:00 pm last night.  Therapist asked him to let his father know we could discharge him today or tomorrow if his father calls or comes to visit.  Therapist offered support and encouragement.  Minimal Progress noted.  Intervention effective.    Marni Griffon 01/01/2012  12:30 pm

## 2012-01-02 MED ORDER — TRAZODONE HCL 100 MG PO TABS: 100.0000 mg | ORAL_TABLET | Freq: Every day | ORAL | Status: DC

## 2012-01-02 MED ORDER — HALOPERIDOL 5 MG PO TABS: 5.0000 mg | ORAL_TABLET | ORAL | Status: DC

## 2012-01-02 NOTE — Progress Notes (Signed)
[facility] Case Management Discharge Plan:  Will you be returning to the same living situation after discharge: Yes,  with father At discharge, do you have transportation home?:Yes,  father Do you have the ability to pay for your medications:Yes,  income  Interagency Information:     Release of information consent forms completed and in the chart;  [name] signature needed at discharge.  Patient to Follow up at:  Follow-up Information    Follow up with Halford Decamp with ACT Team on 01/03/2012. Genevie Cheshire will call ahead to let you know when to expect him on Friday)    Contact information:   [facility] [address] [place]  [address] Telephone:  [phone]      Follow up with [facility] on 01/06/2012. (8AM appointment)    Contact information:   [facility] [address] [place] Telephone:  [phone]         Patient denies SI/HI:   Yes,      Safety Planning and Suicide Prevention discussed:  Yes,  SPI discussed in group.  Barrier to discharge identified:No.  Summary and Recommendations:  Father has been contacted to pick up patient.  He will be assessed by [facility] and they will attempt to get approval to provide ACTT services.  Until then, patient has appointment at [facility].   Sarina Ser 01/02/2012, 1:12 PM

## 2012-01-02 NOTE — [facility] Suicide Risk Assessment (Signed)
Suicide Risk Assessment  Discharge Assessment     Demographic factors:  Male  Current Mental Status Per Nursing Assessment::   On Admission:    At Discharge:  The patient was AO x 3 and was friendly and cooperative with this provider.  The patient denied any significant feelings of sadness, anhedonia or depressed mood and adamantly denied any auditory or visual hallucinations or delusional thinking.  He also adamantly denied any suicidal or homicidal ideations or anxiety symptoms today.    Current Mental Status Per Physician:  Diagnosis:  Axis I: Schizophrenia - Paranoid Type.   The patient was seen today and reports the following:   ADL's: Intact.  Sleep: The patient reports to continuing to sleep well without difficulty with no daytime sedation noted or reported. Appetite: The patient reports a good appetite today.   Mild>(1-10) >Severe  Hopelessness (1-10): 0  Depression (1-10): 0  Anxiety (1-10): 0   Suicidal Ideation: The patient adamantly denies any suicidal ideations today.  Plan: No  Intent: No  Means: No   Homicidal Ideation: The patient adamantly denies any homicidal ideations today.  Plan: No  Intent: No.  Means: No   General Appearance/Behavior: The patient continues to be cooperative today with this provider.  Eye Contact: Good.  Speech:  Appropriate in rate and volume with no pressuring of speech noted today.  Motor Behavior: wnl.  Level of Consciousness: Alert and Oriented x 3.  Mental Status: Alert and Oriented x 3.  Mood: Essentially Euthymic.  Affect: Full.  Anxiety Level: No anxiety reported today.  Thought Process: The patient denies any auditory or visual hallucinations today. He also states that the Voodoo curse is no longer affecting him.  Thought Content: The patient denies any auditory or visual hallucinations today. He also states that the Voodoo curse is no longer affecting him.  Perception: The patient denies any auditory or visual  hallucinations today. He also states that the Voodoo curse is no longer affecting him.  Judgment: Good.  Insight: Good.  Cognition: Oriented to person, place and time.   Loss Factors: Financial problems / change in socioeconomic status  Historical Factors: Long History of Mental Illness.  Non-compliance with medications.  Risk Reduction Factors:   Family Support.  Good access to healthcare.  Continued Clinical Symptoms:  Schizophrenia:   Paranoid or undifferentiated type Previous Psychiatric Diagnoses and Treatments  Discharge Diagnoses:   AXIS I:   Schizophrenia - Paranoid Type. AXIS II:   Deferred. AXIS III:   1. No Acute Illnesses. AXIS IV:   Chronic Mental Illness.  History of medication non-compliance.  AXIS V:   GAF at time of admission approximately 35.  GAF at time of discharge approximately 50.  Cognitive Features That Contribute To Risk:  None Noted.    Current Medications:  Current Facility-Administered Medications   Medication  Dose  Route  Frequency  Provider  Last Rate  Last Dose   .  acetaminophen (TYLENOL) tablet 650 mg  650 mg  Oral  Q6H PRN  Verne Spurr, PA-C     .  alum & mag hydroxide-simeth (MAALOX/MYLANTA) 200-200-20 MG/5ML suspension 30 mL  30 mL  Oral  Q4H PRN  Mike Craze, MD     .  haloperidol (HALDOL) tablet 5 mg  5 mg  Oral  BH-qamhs  Curlene Labrum Tywan Siever, MD   5 mg at 01/01/12 0732   .  ibuprofen (ADVIL,MOTRIN) tablet 600 mg  600 mg  Oral  Q8H PRN  Dorma Russell  Tawnya Crook, MD     .  magnesium hydroxide (MILK OF MAGNESIA) suspension 30 mL  30 mL  Oral  Daily PRN  Verne Spurr, PA-C     .  nicotine (NICODERM CQ - dosed in mg/24 hours) patch 21 mg  21 mg  Transdermal  Q0600  Curlene Labrum Ivery Michalski, MD   21 mg at 01/01/12 0612   .  traZODone (DESYREL) tablet 100 mg  100 mg  Oral  QHS  Ronny Bacon, MD     .  DISCONTD: traZODone (DESYREL) tablet 150 mg  150 mg  Oral  QHS  Curlene Labrum Malea Swilling, MD   150 mg at 12/31/11 2149    Lab Results: No results found for this  or any previous visit (from the past 48 hour(s)).   Review of Systems:  Neurological: No headaches, seizures or dizziness reported.  G.I.: The patient denies any constipation or stomach upset today.  Musculoskeletal: The patient denies any musculoskeletal issues today.   Time was spent today discussing with the patient his current symptoms. The patient reports to continuing to sleep well without difficulty and reports a good appetite. He denies any significant feeling of sadness, anhedonia or depressed mood and adamantly denies any suicidal or homicidal ideations today.  He also denies any auditory or visual hallucinations and denies any delusional thinking and reports that the Voodoo curse is no longer affecting him.  The patient denies any medication related side effects or other concerns today and feels he is ready for discharge.  Treatment Plan Summary:  1. Daily contact with patient to assess and evaluate symptoms and progress in treatment.  2. Medication management  3. The patient will deny suicidal ideations or homicidal ideations for 48 hours prior to discharge and have a depression and anxiety rating of 3 or less. The patient will also deny any auditory or visual hallucinations or delusional thinking.  4. The patient will deny any symptoms of substance withdrawal at time of discharge.   Plan:  1. Will continue the medication Haldol at 5 mgs po q am and hs to continue to address the patients psychosis and to provide more additional mood stabilization.  2. Will continue the medication Trazodone at 100 mgs po qhs for sleep and will decrease due to daytime sedation.  3. Laboratory Studies reviewed.  4. Will continue to monitor.  5. Will discharge the patient today to outpatient care.  Suicide Risk:  Minimal: No identifiable suicidal ideation.  Patients presenting with no risk factors but with morbid ruminations; may be classified as minimal risk based on the severity of the depressive  symptoms  Plan Of Care/Follow-up recommendations:  Activity:  As tolerated. Diet:  Regular Diet. Other:  Please take all medications only as directed and please keep all scheduled follow up appointments.  Shaunae Sieloff 01/02/2012, 11:38 AM

## 2012-01-02 NOTE — Progress Notes (Signed)
[facility] Group Notes:  (Counselor/Nursing/MHT/Case Management/Adjunct)  01/02/2012 10:30 AM  Type of Therapy: Group Therapy   Participation Level: Minimal   Participation Quality:  Sharing, Attentive   Affect: Blunted  Cognitive:  Oriented, Alert  Insight: Poor  Engagement in Group: Limited  Modes of Intervention: Clarification, Education, Problem-solving, Socialization and Support   Summary of Progress/Problems: Pt participated in group by listening attentively and self disclosing.  Therapist prompted Pts to report on progress with goals.  Pt acknowledged some progress.  Therapist prompted Pt to express feelings openly,  identify and work on the underlying issue.  Pt explained that apparently his father came last night to take him home but it was too late and there was not a written order.  Therapist informed Pt that they would continue to try to reach his father on his new cell phone [phone] and through his work at [employer] in [place], [place].  Therapist offered support and encouragement.  Minimal Progress noted.  Intervention effective.    Marni Griffon 01/02/2012  10:30 AM

## 2012-01-02 NOTE — Progress Notes (Signed)
Pt denies SI/HI/AVH. Pt discharged to home. F/u appts, prescriptions, and discharge instructions explained to pt. Pt states understanding.

## 2012-01-02 NOTE — Progress Notes (Signed)
As of this time, Pt. has not yet been able to contact his father to come to pick him up.  Pt. states he will continue to try to reach him and will notify nursing if he believes he will not be able to leave tonight.

## 2012-01-02 NOTE — Tx Team (Signed)
Interdisciplinary Treatment Plan Update (Adult)  Date:  01/02/2012  Time Reviewed:  10:15AM-11:15AM  Progress in Treatment: Attending groups:  Yes Participating in groups:    Yes, when staff draws him out Taking medication as prescribed:    Yes Tolerating medication:   Yes Family/Significant other contact made:  Has been attempted multiple times Patient understands diagnosis:   Yes, poor insight, poor judgment Discussing patient identified problems/goals with staff:   Yes Medical problems stabilized or resolved:   Yes Denies suicidal/homicidal ideation:  Yes Issues/concerns per patient self-inventory:   None Other:    New problem(s) identified: Yes, Describe:  If we cannot get in touch with father, patient will not be able to discharge until tomorrow.  Reason for Continuation of Hospitalization: None  Interventions implemented related to continuation of hospitalization:  Medication monitoring and adjustment, safety checks Q15 min., suicide risk assessment, group therapy, psychoeducation, collateral contact, aftercare planning, ongoing physician assessments, medication education - UNTIL DISCHARGE  Additional comments:  Not applicable  Estimated length of stay:  Discharge today  Discharge Plan:  Return to live with father, then get his own apartment.  Follow up with [facility], but go to appointments at [facility] until that service starts.  New goal(s):  Not applicable  Review of initial/current patient goals per problem list:   1.  Goal(s):  Medication stabilization  Met:  Yes  Target date:  By Discharge   As evidenced by:  Stable for discharge  2.  Goal(s):  Reduce psychotic symptoms to baseline (auditory hallucinations, delusions)  Met:  Yes  Target date:  By Discharge   As evidenced by:  At baseline, no more voodoo curses felt  3.  Goal(s):  Refer for higher level of service than regular outpatient.  Met:  Yes  Target date:  By Discharge   As evidenced by:  Being  assessed for ACTT  4.  Goal(s):  Deny SI/HI for 48 hours prior to D/C.  Met:  Yes  Target date:  By Discharge   As evidenced by:  Denies  5. Goal(s): Determine placement and aftercare at discharge.  Met: Yes Target date: By Discharge  As evidenced by: Done, see above  Attendees: Patient:  Gregory Jackson  01/02/2012 10:15AM-11:15AM  Family:     Physician:  Dr. Harvie Heck Readling 01/02/2012 10:15AM-11:15AM  Nursing:   Manuela Schwartz, RN 01/02/2012 10:15AM -11:15AM   Case Manager:  Ambrose Mantle, LCSW 01/02/2012 10:15AM-11:15AM  Counselor:  Marni Griffon, LCAS 01/02/2012 10:15AM-11:15AM  Other:      Other:      Other:      Other:       Scribe for Treatment Team:   Sarina Ser, 01/02/2012, 10:15AM-11:15AM

## 2012-01-02 NOTE — Progress Notes (Signed)
[facility] Group Notes:  (Counselor/Nursing/MHT/Case Management/Adjunct)  01/02/2012 12:30 PM  Type of Therapy: Group Therapy   Participation Level: Minimal   Participation Quality:  Sharing, Attentive   Affect: Blunted  Cognitive:  Oriented, Alert  Insight: Poor  Engagement in Group: Limited  Modes of Intervention: Clarification, Education, Problem-solving, Socialization and Support   Summary of Progress/Problems: Pt actively participated in group focused on elevating mood and developing coping skills.  Therapist prompted patients to identify things that made them happy.  Pt explained that accomplishing things and playing games on the Internet made him happy.  Therapist encouraged patient to add positive activities to their lives and associate with positive people.  Therapist offered support and encouragement.  Minimal Progress noted.  Intervention effective.    Marni Griffon 01/02/2012  12:30 PM

## 2012-01-02 NOTE — Progress Notes (Signed)
Patient up and visible in the milieu today.  Continues to be quiet and stays in his room some, but does come out to the dayroom and interact a bit with others.  Denies visual hallucinations/delusions at this time.  Affect brighter.  Looking forward to discharge later today.  Appetite good.  Pleasant and cooperative with staff and peers.

## 2012-01-03 NOTE — Progress Notes (Signed)
Patient Discharge Instructions:  After Visit Summary (AVS):   Faxed to:  01/03/2012 Psychiatric Admission Assessment Note:   Faxed to:  01/03/2012 Suicide Risk Assessment - Discharge Assessment:   Faxed to:  01/03/2012 Faxed/Sent to the Next Level Care provider:  01/03/2012  Faxed to [facility] @ [phone] And to [facility] @ [phone]  Wandra Scot, 01/03/2012, 4:43 PM
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
## 2012-02-23 ENCOUNTER — Encounter (HOSPITAL_COMMUNITY): Payer: Self-pay | Admitting: Emergency Medicine

## 2012-02-23 ENCOUNTER — Emergency Department (HOSPITAL_COMMUNITY)
Admission: EM | Admit: 2012-02-23 | Discharge: 2012-02-26 | Disposition: A | Discharge status: Other Acute Inpatient Hospital | Payer: Self-pay | Attending: Emergency Medicine | Admitting: Emergency Medicine

## 2012-02-23 DIAGNOSIS — Z9119 Patient's noncompliance with other medical treatment and regimen: Secondary | ICD-10-CM | POA: Insufficient documentation

## 2012-02-23 DIAGNOSIS — Z91199 Patient's noncompliance with other medical treatment and regimen due to unspecified reason: Secondary | ICD-10-CM | POA: Insufficient documentation

## 2012-02-23 DIAGNOSIS — F209 Schizophrenia, unspecified: Secondary | ICD-10-CM | POA: Insufficient documentation

## 2012-02-23 DIAGNOSIS — F17200 Nicotine dependence, unspecified, uncomplicated: Secondary | ICD-10-CM | POA: Insufficient documentation

## 2012-02-23 DIAGNOSIS — Z79899 Other long term (current) drug therapy: Secondary | ICD-10-CM | POA: Insufficient documentation

## 2012-02-23 LAB — URINALYSIS, ROUTINE W REFLEX MICROSCOPIC
Glucose, UA: NEGATIVE mg/dL
Ketones, ur: NEGATIVE mg/dL
Nitrite: POSITIVE — AB
Specific Gravity, Urine: >1.03 — ABNORMAL HIGH (ref 1.005–1.030)
pH: 5.5 (ref 5.0–8.0)

## 2012-02-23 LAB — ETHANOL: Alcohol, Ethyl (B): <11 mg/dL (ref 0–11)

## 2012-02-23 LAB — BASIC METABOLIC PANEL
Calcium: 10.2 mg/dL (ref 8.4–10.5)
Creatinine, Ser: 1.11 mg/dL (ref 0.50–1.35)
GFR calc Af Amer: >90 mL/min (ref >90)
Sodium: 137 mEq/L (ref 135–145)

## 2012-02-23 LAB — CBC WITH DIFFERENTIAL/PLATELET
Basophils Absolute: 0 10*3/uL (ref 0.0–0.1)
Basophils Relative: 0 % (ref 0–1)
Eosinophils Relative: 1 % (ref 0–5)
HCT: 46.2 % (ref 39.0–52.0)
MCHC: 35.3 g/dL (ref 30.0–36.0)
MCV: 86 fL (ref 78.0–100.0)
Monocytes Absolute: 0.9 10*3/uL (ref 0.1–1.0)
Neutro Abs: 5.9 10*3/uL (ref 1.7–7.7)
Platelets: 222 10*3/uL (ref 150–400)
RDW: 13 % (ref 11.5–15.5)
WBC: 9.6 10*3/uL (ref 4.0–10.5)

## 2012-02-23 LAB — RAPID URINE DRUG SCREEN, HOSP PERFORMED
Amphetamines: NOT DETECTED
Barbiturates: NOT DETECTED
Benzodiazepines: NOT DETECTED
Cocaine: NOT DETECTED

## 2012-02-23 LAB — URINE MICROSCOPIC-ADD ON

## 2012-02-23 MED ORDER — TRAZODONE HCL 50 MG PO TABS
ORAL_TABLET | ORAL | Status: AC
  Filled 2012-02-23: qty 2

## 2012-02-23 MED ORDER — ALUM & MAG HYDROXIDE-SIMETH 200-200-20 MG/5ML PO SUSP: 30.0000 mL | ORAL | Status: DC | PRN

## 2012-02-23 MED ORDER — ZOLPIDEM TARTRATE 5 MG PO TABS: 5.0000 mg | ORAL_TABLET | Freq: Every evening | ORAL | Status: DC | PRN

## 2012-02-23 MED ORDER — TRAZODONE HCL 50 MG PO TABS
100.0000 mg | ORAL_TABLET | Freq: Every day | ORAL | Status: DC
  Administered 2012-02-23 – 2012-02-25 (×3): 100 mg via ORAL
  Filled 2012-02-23: qty 2
  Filled 2012-02-23 (×2): qty 1
  Filled 2012-02-23 (×2): qty 2

## 2012-02-23 MED ORDER — IBUPROFEN 400 MG PO TABS: 600.0000 mg | ORAL_TABLET | Freq: Three times a day (TID) | ORAL | Status: DC | PRN

## 2012-02-23 MED ORDER — LORAZEPAM 1 MG PO TABS: 1.0000 mg | ORAL_TABLET | Freq: Three times a day (TID) | ORAL | Status: DC | PRN

## 2012-02-23 MED ORDER — ACETAMINOPHEN 325 MG PO TABS: 650.0000 mg | ORAL_TABLET | ORAL | Status: DC | PRN

## 2012-02-23 MED ORDER — HALOPERIDOL 5 MG PO TABS
5.0000 mg | ORAL_TABLET | Freq: Two times a day (BID) | ORAL | Status: DC
  Administered 2012-02-23 – 2012-02-26 (×6): 5 mg via ORAL
  Filled 2012-02-23 (×4): qty 1

## 2012-02-23 MED ORDER — ONDANSETRON HCL 4 MG PO TABS: 4.0000 mg | ORAL_TABLET | Freq: Three times a day (TID) | ORAL | Status: DC | PRN

## 2012-02-23 MED ORDER — NICOTINE 14 MG/24HR TD PT24
14.0000 mg | MEDICATED_PATCH | Freq: Every day | TRANSDERMAL | Status: DC
  Filled 2012-02-23: qty 1

## 2012-02-23 NOTE — ED Notes (Signed)
Moved back to hallway 8

## 2012-02-23 NOTE — ED Notes (Signed)
Officer states when patient was picked up from home, patient was reporting "Leeman is dead, referring to himself." When patient asked who Catcher is, patient repeats "Ziggy is dead", referring to himself.

## 2012-02-23 NOTE — ED Provider Notes (Signed)
History     CSN: [id number]  Arrival date & time 02/23/12  1919   First MD Initiated Contact with Patient 02/23/12 1947      Chief Complaint  Patient presents with  . Medical Clearance    (Consider location/radiation/quality/duration/timing/severity/associated sxs/prior treatment) The history is provided by the police. The history is limited by the condition of the patient (psychosis).   36 year old male was brought in by police. He has a psychiatric history and apparently has not been taking his medications. He was found wandering around town. He is not able to give any history and states he does not know why he is in the emergency department.  Past Medical History  Diagnosis Date  . Schizophrenia     History reviewed. No pertinent past surgical history.  History reviewed. No pertinent family history.  History  Substance Use Topics  . Smoking status: Current Everyday Smoker  . Smokeless tobacco: Not on file  . Alcohol Use: No      Review of Systems  Unable to perform ROS: Psychiatric disorder    Allergies  Review of patient's allergies indicates no known allergies.  Home Medications   Current Outpatient Rx  Name Route Sig Dispense Refill  . HALOPERIDOL 5 MG PO TABS Oral Take 1 tablet (5 mg total) by mouth 2 (two) times daily in the am and at bedtime.. For psychosis. 60 tablet 0    BP 129/91  Pulse 62  Temp 98.6 F (37 C) (Oral)  Resp 18  Ht 6\' 4"  (1.93 m)  Wt 185 lb (83.915 kg)  BMI 22.52 kg/m2  SpO2 93%  Physical Exam  Nursing note and vitals reviewed. 36year old male, resting comfortably and in no acute distress. Vital signs are significant for borderline diastolic hypertension with blood pressure 129/91. Oxygen saturation is 93%, which is normal. Head is normocephalic and atraumatic. PERRLA, EOMI. Oropharynx is clear. Neck is nontender and supple without adenopathy or JVD. Back is nontender and there is no CVA tenderness. Lungs are clear without  rales, wheezes, or rhonchi. Chest is nontender. Heart has regular rate and rhythm without murmur. Abdomen is soft, flat, nontender without masses or hepatosplenomegaly and peristalsis is normoactive. Extremities have no cyanosis or edema, full range of motion is present. Skin is warm and dry without rash. Neurologic: He is awake, alert, oriented to person and place but not time, cranial nerves are intact, there are no motor or sensory deficits. Psychiatric: Affect is flat and he is slow to answer questions although does answer questions appropriately. He denies hallucinations.  ED Course  Procedures (including critical care time)  Results for orders placed during the hospital encounter of 02/23/12  URINALYSIS, ROUTINE W REFLEX MICROSCOPIC      Component Value Range   Color, Urine AMBER (*) YELLOW   APPearance CLEAR  CLEAR   Specific Gravity, Urine >1.030 (*) 1.005 - 1.030   pH 5.5  5.0 - 8.0   Glucose, UA NEGATIVE  NEGATIVE mg/dL   Hgb urine dipstick SMALL (*) NEGATIVE   Bilirubin Urine MODERATE (*) NEGATIVE   Ketones, ur NEGATIVE  NEGATIVE mg/dL   Protein, ur 30 (*) NEGATIVE mg/dL   Urobilinogen, UA 1.0  0.0 - 1.0 mg/dL   Nitrite POSITIVE (*) NEGATIVE   Leukocytes, UA NEGATIVE  NEGATIVE  URINE RAPID DRUG SCREEN (HOSP PERFORMED)      Component Value Range   Opiates NONE DETECTED  NONE DETECTED   Cocaine NONE DETECTED  NONE DETECTED   Benzodiazepines NONE  DETECTED  NONE DETECTED   Amphetamines NONE DETECTED  NONE DETECTED   Tetrahydrocannabinol NONE DETECTED  NONE DETECTED   Barbiturates NONE DETECTED  NONE DETECTED  CBC WITH DIFFERENTIAL      Component Value Range   WBC 9.6  4.0 - 10.5 K/uL   RBC 5.37  4.22 - 5.81 MIL/uL   Hemoglobin 16.3  13.0 - 17.0 g/dL   HCT 16.1  09.6 - 04.5 %   MCV 86.0  78.0 - 100.0 fL   MCH 30.4  26.0 - 34.0 pg   MCHC 35.3  30.0 - 36.0 g/dL   RDW 40.9  81.1 - 91.4 %   Platelets 222  150 - 400 K/uL   Neutrophils Relative 62  43 - 77 %   Neutro  Abs 5.9  1.7 - 7.7 K/uL   Lymphocytes Relative 27  12 - 46 %   Lymphs Abs 2.6  0.7 - 4.0 K/uL   Monocytes Relative 10  3 - 12 %   Monocytes Absolute 0.9  0.1 - 1.0 K/uL   Eosinophils Relative 1  0 - 5 %   Eosinophils Absolute 0.1  0.0 - 0.7 K/uL   Basophils Relative 0  0 - 1 %   Basophils Absolute 0.0  0.0 - 0.1 K/uL  BASIC METABOLIC PANEL      Component Value Range   Sodium 137  135 - 145 mEq/L   Potassium 3.4 (*) 3.5 - 5.1 mEq/L   Chloride 99  96 - 112 mEq/L   CO2 24  19 - 32 mEq/L   Glucose, Bld 93  70 - 99 mg/dL   BUN 17  6 - 23 mg/dL   Creatinine, Ser 7.82  0.50 - 1.35 mg/dL   Calcium 95.6  8.4 - 21.3 mg/dL   GFR calc non Af Amer 83 (*) >90 mL/min   GFR calc Af Amer >90  >90 mL/min  ETHANOL      Component Value Range   Alcohol, Ethyl (B) <11  0 - 11 mg/dL  URINE MICROSCOPIC-ADD ON      Component Value Range   WBC, UA 7-10  <3 WBC/hpf   RBC / HPF 3-6  <3 RBC/hpf   Bacteria, UA FEW (*) RARE   Urine-Other MUCOUS PRESENT       1. Schizophrenia       MDM  Exacerbation of schizophrenia secondary to medication noncompliance. Psychiatric consultation will be obtained to get medication recommendations, and ACT Team will be consulted to assist with psychiatric placement. Prior records are reviewed, and he had been admitted to [facility] in June of this year and the precipitating event also seem to be medication noncompliance.  Psychiatry consultation is appreciated. Psychiatrist recommends getting him restarted on his Haldol and trazodone.      Dione Booze, MD 02/24/12 [id number]

## 2012-02-23 NOTE — ED Notes (Signed)
Patient speaking with tele-psych in room 14 at this time.

## 2012-02-23 NOTE — ED Notes (Signed)
Tele-psych complete. ACT Team to come speak with patient. Patient continues to be cooperative at this time.

## 2012-02-23 NOTE — BH Assessment (Signed)
Assessment Note   Gregory Jackson is an 36 y.o. male. PT WAS BROUGHT IN BY EMS AFTER HE WAS FOUND WANDERING CONFUSED & EXHIBITING BIZARRE BEHAVIOR. PT EXPRESSED THAT HE HAD BEEN OFF HIS MEDS & HAD EXPRESSED PASSIVE IDEATION TO TRIAGE NURSE BUT DENIED IT TO CLINICIAN & ADMITS HOMICIDAL THOUGHTS TOWARD SOMEONE TO HIS NURSE. PT HAS CONFLICTING STATEMENT & IS DESPONDENT. PT SEEMS TO RESPONDING TO INTERNAL STIMULI & STIRRING INTO SPACE. EDP WANTS PT ADMITTED & STABILIZED. A TELEPSYCH WAS DONE & A PSYCH ADMISSION WAS RECOMMENDED. PT HAS BEEN REFERRED TO [facility] & IS PENDING DISPOSITION.  Axis I: SCHIZOPHRENIA Axis II: Deferred Axis III:  Past Medical History  Diagnosis Date  . Schizophrenia    Axis IV: economic problems, housing problems, other psychosocial or environmental problems, problems related to social environment and problems with primary support group Axis V: 21-30 behavior considerably influenced by delusions or hallucinations OR serious impairment in judgment, communication OR inability to function in almost all areas  Past Medical History:  Past Medical History  Diagnosis Date  . Schizophrenia     History reviewed. No pertinent past surgical history.  Family History: History reviewed. No pertinent family history.  Social History:  reports that he has been smoking.  He does not have any smokeless tobacco history on file. He reports that he does not drink alcohol or use illicit drugs.  Additional Social History:     CIWA: CIWA-Ar BP: 110/64 mmHg Pulse Rate: 58  COWS:    Allergies: No Known Allergies  Home Medications:  (Not in a hospital admission)  OB/GYN Status:  No LMP for male patient.  General Assessment Data Location of Assessment: [facility] ACT Assessment: Yes Living Arrangements: Alone Can pt return to current living arrangement?: Yes Admission Status: Voluntary Is patient capable of signing voluntary admission?: Yes Transfer from: Acute Hospital Referral  Source: MD     Risk to self Suicidal Ideation: No Suicidal Intent: No Is patient at risk for suicide?: No Suicidal Plan?: No Access to Means: No What has been your use of drugs/alcohol within the last 12 months?: NA Previous Attempts/Gestures: No How many times?: 0  Other Self Harm Risks: NA Triggers for Past Attempts: Unpredictable;Hallucinations Intentional Self Injurious Behavior: None Family Suicide History: Unknown Recent stressful life event(s): Turmoil (Comment) Persecutory voices/beliefs?: Yes Depression: Yes Depression Symptoms: Loss of interest in usual pleasures;Insomnia;Despondent;Isolating;Feeling worthless/self pity Substance abuse history and/or treatment for substance abuse?: No Suicide prevention information given to non-admitted patients: Not applicable  Risk to Others Homicidal Ideation: No Thoughts of Harm to Others: No Current Homicidal Intent: No Current Homicidal Plan: No Access to Homicidal Means: No Identified Victim: NA History of harm to others?: No Assessment of Violence: None Noted Violent Behavior Description: CALM, COOPERATIVE, QUITE, SLOW TO RESPOND Does patient have access to weapons?: No Criminal Charges Pending?: No Does patient have a court date: No  Psychosis Hallucinations: None noted Delusions: None noted  Mental Status Report Appear/Hygiene: Body odor;Disheveled;Poor hygiene Eye Contact: Poor Motor Activity: Freedom of movement;Psychomotor retardation Speech: Logical/coherent;Soft Level of Consciousness: Alert Mood: Depressed;Ambivalent;Anhedonia;Sad Affect: Appropriate to circumstance;Depressed;Sad Anxiety Level: None Thought Processes: Coherent;Relevant Judgement: Unimpaired Orientation: Person;Place;Time;Situation Obsessive Compulsive Thoughts/Behaviors: None  Cognitive Functioning Concentration: Decreased Memory: Recent Intact;Remote Intact IQ: Average Insight: Poor Impulse Control: Poor Appetite: Poor Weight  Loss: 0  Weight Gain: 0  Sleep: Decreased Total Hours of Sleep: 0  Vegetative Symptoms: Not bathing;Decreased grooming  ADLScreening [facility] Assessment Services) [name] cognitive ability adequate to safely complete  daily activities?: Yes Patient able to express need for assistance with ADLs?: Yes Independently performs ADLs?: Yes  Abuse/Neglect [facility]) Physical Abuse: Denies Verbal Abuse: Denies Sexual Abuse: Denies  Prior Inpatient Therapy Prior Inpatient Therapy: Yes Prior Therapy Dates: UNK Prior Therapy Facilty/Provider(s): UNK Reason for Treatment: UNK  Prior Outpatient Therapy Prior Outpatient Therapy: Yes Prior Therapy Dates: UNK Prior Therapy Facilty/Provider(s): UNK Reason for Treatment: UNK  ADL Screening (condition at time of admission) [name] cognitive ability adequate to safely complete daily activities?: Yes Patient able to express need for assistance with ADLs?: Yes Independently performs ADLs?: Yes       Abuse/Neglect Assessment (Assessment to be complete while patient is alone) Physical Abuse: Denies Verbal Abuse: Denies Sexual Abuse: Denies Values / Beliefs Cultural Requests During Hospitalization: None Spiritual Requests During Hospitalization: None        Additional Information 1:1 In Past 12 Months?: No CIRT Risk: No Elopement Risk: No Does patient have medical clearance?: Yes     Disposition:  Disposition Disposition of Patient: Inpatient treatment program;Referred to ([facility] PENDING DISPOSITION) Type of inpatient treatment program: Adult  On Site Evaluation by:   Reviewed with Physician:     Waldron Session 02/23/2012 11:17 PM

## 2012-02-23 NOTE — ED Notes (Signed)
Patient brought in by sheriff's department. [name] father took out IVC paperwork on patient due to patient not taking psychiatric medication as he is supposed to. [name] father reports patient has been walking around town and the woods and refusing to take medication because he states that he is not mentally ill anymore and does not need it. When patient asked why he is here and if he has stopped taking his medication, patient mumbles no, I don't take stuff. Patient mumbling and talking to himself. Will not answer anymore questions.

## 2012-02-23 NOTE — ED Notes (Signed)
Patient undressed into paper scrubs by officer and Joseph Art, NT. Patient wanded by security.

## 2012-02-24 NOTE — ED Notes (Signed)
Pt was offered hygiene products. Pt stated all he wanted was some deodorant. Pt was given deodorant. Pt was also told that he could watch TV. Pt does not want to watch TV. [employer] at bedside. Left foot shackled to the bed frame.

## 2012-02-24 NOTE — BHH Counselor (Signed)
Patient has been accepted by Dr. Theotis Barrio to [facility] to the services of Dr. Allena Katz pending bed availability.

## 2012-02-25 NOTE — ED Provider Notes (Signed)
[id number] Patient with psychosis, evaluated by tele psych, ACT. He is awaiting placement.  [name], MD 02/25/12 0800

## 2012-02-25 NOTE — BH Assessment (Signed)
Assessment Note   Gregory Jackson is an 36 y.o. male  Pt continues to remain psychotic.  [facility] is accepted at [facility]. Spoke with [facility] Thurman Coyer who confirmed that there are no 400 hall beds at this time.      Axis I: Schizoaffective Disorder Axis II: Deferred Axis III:  Past Medical History  Diagnosis Date  . Schizophrenia    Axis IV: other psychosocial or environmental problems, problems related to social environment and problems with primary support group Axis V: 11-20 some danger of hurting self or others possible OR occasionally fails to maintain minimal personal hygiene OR gross impairment in communication       Past Medical History:  Past Medical History  Diagnosis Date  . Schizophrenia     History reviewed. No pertinent past surgical history.  Family History: History reviewed. No pertinent family history.  Social History:  reports that he has been smoking.  He does not have any smokeless tobacco history on file. He reports that he does not drink alcohol or use illicit drugs.  Additional Social History:     CIWA: CIWA-Ar BP: 118/65 mmHg Pulse Rate: 57  COWS:    Allergies: No Known Allergies  Home Medications:  (Not in a hospital admission)  OB/GYN Status:  No LMP for male patient.  General Assessment Data Location of Assessment: [facility] ACT Assessment: Yes Living Arrangements: Alone Can pt return to current living arrangement?: Yes Admission Status: Voluntary Is patient capable of signing voluntary admission?: Yes Transfer from: Acute Hospital Referral Source: MD     Risk to self Suicidal Ideation: No Suicidal Intent: No Is patient at risk for suicide?: No Suicidal Plan?: No Access to Means: No What has been your use of drugs/alcohol within the last 12 months?: NA Previous Attempts/Gestures: No How many times?: 0  Other Self Harm Risks: NA Triggers for Past Attempts: Unpredictable;Hallucinations Intentional Self Injurious Behavior:  None Family Suicide History: Unknown Recent stressful life event(s): Turmoil (Comment) Persecutory voices/beliefs?: Yes Depression: Yes Depression Symptoms: Loss of interest in usual pleasures;Insomnia;Despondent;Isolating;Feeling worthless/self pity Substance abuse history and/or treatment for substance abuse?: No Suicide prevention information given to non-admitted patients: Not applicable  Risk to Others Homicidal Ideation: No Thoughts of Harm to Others: No Current Homicidal Intent: No Current Homicidal Plan: No Access to Homicidal Means: No Identified Victim: NA History of harm to others?: No Assessment of Violence: None Noted Violent Behavior Description: CALM, COOPERATIVE, QUITE, SLOW TO RESPOND Does patient have access to weapons?: No Criminal Charges Pending?: No Does patient have a court date: No  Psychosis Hallucinations: None noted Delusions: None noted  Mental Status Report Appear/Hygiene: Body odor;Disheveled;Poor hygiene Eye Contact: Poor Motor Activity: Freedom of movement;Psychomotor retardation Speech: Logical/coherent;Soft Level of Consciousness: Alert Mood: Depressed;Ambivalent;Anhedonia;Sad Affect: Appropriate to circumstance;Depressed;Sad Anxiety Level: None Thought Processes: Coherent;Relevant Judgement: Unimpaired Orientation: Person;Place;Time;Situation Obsessive Compulsive Thoughts/Behaviors: None  Cognitive Functioning Concentration: Decreased Memory: Recent Intact;Remote Intact IQ: Average Insight: Poor Impulse Control: Poor Appetite: Poor Weight Loss: 0  Weight Gain: 0  Sleep: Decreased Total Hours of Sleep: 0  Vegetative Symptoms: Not bathing;Decreased grooming  ADLScreening [facility] Assessment Services) [name] cognitive ability adequate to safely complete daily activities?: Yes Patient able to express need for assistance with ADLs?: Yes Independently performs ADLs?: Yes  Abuse/Neglect [facility]) Physical Abuse: Denies Verbal Abuse:  Denies Sexual Abuse: Denies  Prior Inpatient Therapy Prior Inpatient Therapy: Yes Prior Therapy Dates: UNK Prior Therapy Facilty/Provider(s): UNK Reason for Treatment: UNK  Prior Outpatient Therapy Prior Outpatient Therapy:  Yes Prior Therapy Dates: UNK Prior Therapy Facilty/Provider(s): UNK Reason for Treatment: UNK  ADL Screening (condition at time of admission) [name] cognitive ability adequate to safely complete daily activities?: Yes Patient able to express need for assistance with ADLs?: Yes Independently performs ADLs?: Yes       Abuse/Neglect Assessment (Assessment to be complete while patient is alone) Physical Abuse: Denies Verbal Abuse: Denies Sexual Abuse: Denies Values / Beliefs Cultural Requests During Hospitalization: None Spiritual Requests During Hospitalization: None     Nutrition Screen Diet: Regular (pt is eating dinner in his room)  Additional Information 1:1 In Past 12 Months?: No CIRT Risk: No Elopement Risk: No Does patient have medical clearance?: Yes     Disposition: pt accepted at [facility]  Pending 400 hall bed   Disposition Disposition of Patient: Inpatient treatment program;Referred to Type of inpatient treatment program: Adult Patient referred to: Other (Comment) ([facility] hall bed)  On Site Evaluation by:   Reviewed with Physician:  Dr Alphia Kava, [place] Jack 02/25/2012 7:48 PM

## 2012-02-26 ENCOUNTER — Inpatient Hospital Stay (HOSPITAL_COMMUNITY)
Admission: AD | Admit: 2012-02-26 | Discharge: 2012-03-02 | DRG: 885 | Disposition: A | Discharge status: Home / Self Care | Payer: 59 | Source: Other Acute Inpatient Hospital | Attending: Psychiatry | Admitting: Psychiatry

## 2012-02-26 ENCOUNTER — Encounter (HOSPITAL_COMMUNITY): Payer: Self-pay

## 2012-02-26 DIAGNOSIS — F200 Paranoid schizophrenia: Principal | ICD-10-CM | POA: Diagnosis present

## 2012-02-26 DIAGNOSIS — Z9119 Patient's noncompliance with other medical treatment and regimen: Secondary | ICD-10-CM

## 2012-02-26 DIAGNOSIS — E876 Hypokalemia: Secondary | ICD-10-CM | POA: Diagnosis present

## 2012-02-26 MED ORDER — TRAZODONE HCL 100 MG PO TABS
100.0000 mg | ORAL_TABLET | Freq: Every day | ORAL | Status: DC
  Administered 2012-02-26: 100 mg via ORAL
  Filled 2012-02-26 (×4): qty 1

## 2012-02-26 MED ORDER — CIPROFLOXACIN HCL 500 MG PO TABS: 500.0000 mg | ORAL_TABLET | Freq: Two times a day (BID) | ORAL | Status: DC

## 2012-02-26 MED ORDER — MAGNESIUM HYDROXIDE 400 MG/5ML PO SUSP: 30.0000 mL | Freq: Every day | ORAL | Status: DC | PRN

## 2012-02-26 MED ORDER — ALUM & MAG HYDROXIDE-SIMETH 200-200-20 MG/5ML PO SUSP: 30.0000 mL | ORAL | Status: DC | PRN

## 2012-02-26 MED ORDER — HALOPERIDOL 5 MG PO TABS
ORAL_TABLET | ORAL | Status: AC
  Administered 2012-02-26: 5 mg via ORAL
  Filled 2012-02-26: qty 1

## 2012-02-26 MED ORDER — CIPROFLOXACIN HCL 250 MG PO TABS
250.0000 mg | ORAL_TABLET | Freq: Two times a day (BID) | ORAL | Status: DC
  Administered 2012-02-26 – 2012-02-27 (×2): 250 mg via ORAL
  Filled 2012-02-26 (×7): qty 1

## 2012-02-26 MED ORDER — HALOPERIDOL 5 MG PO TABS
5.0000 mg | ORAL_TABLET | ORAL | Status: DC
  Administered 2012-02-26 – 2012-03-02 (×10): 5 mg via ORAL
  Filled 2012-02-26 (×5): qty 1
  Filled 2012-02-26: qty 28
  Filled 2012-02-26 (×9): qty 1
  Filled 2012-02-26: qty 28

## 2012-02-26 MED ORDER — POTASSIUM CHLORIDE CRYS ER 10 MEQ PO TBCR
10.0000 meq | EXTENDED_RELEASE_TABLET | ORAL | Status: AC
  Administered 2012-02-26 – 2012-02-29 (×6): 10 meq via ORAL
  Filled 2012-02-26 (×7): qty 1

## 2012-02-26 MED ORDER — NICOTINE 14 MG/24HR TD PT24
MEDICATED_PATCH | TRANSDERMAL | Status: AC
  Filled 2012-02-26: qty 1

## 2012-02-26 MED ORDER — ACETAMINOPHEN 325 MG PO TABS: 650.0000 mg | ORAL_TABLET | Freq: Four times a day (QID) | ORAL | Status: DC | PRN

## 2012-02-26 MED ORDER — NICOTINE 21 MG/24HR TD PT24
MEDICATED_PATCH | TRANSDERMAL | Status: AC
  Filled 2012-02-26: qty 1

## 2012-02-26 NOTE — BH Assessment (Signed)
Assessment Note   Gregory Jackson is an 36 y.o. male. The patient remains paranoid and hypervigilant. He has not been compliant with medications. He is guarded and selective in answering questions. He has expressed both HI and SI, and then denied both. He has been accepted to [facility] to the service of Dr Allena Katz. He will be transported by [employer]. He is IVC.    Axis I: Chronic Paranoid Schizophrenia Axis II: Deferred Axis III:  Past Medical History  Diagnosis Date  . Schizophrenia    Axis IV: economic problems, housing problems, problems with access to health care services and problems with primary support group Axis V: 21-30 behavior considerably influenced by delusions or hallucinations OR serious impairment in judgment, communication OR inability to function in almost all areas  Past Medical History:  Past Medical History  Diagnosis Date  . Schizophrenia     History reviewed. No pertinent past surgical history.  Family History: History reviewed. No pertinent family history.  Social History:  reports that he has been smoking.  He does not have any smokeless tobacco history on file. He reports that he does not drink alcohol or use illicit drugs.  Additional Social History:     CIWA: CIWA-Ar BP: 119/64 mmHg Pulse Rate: 51  COWS:    Allergies: No Known Allergies  Home Medications:  (Not in a hospital admission)  OB/GYN Status:  No LMP for male patient.  General Assessment Data Location of Assessment: [facility] ACT Assessment: Yes Living Arrangements: Alone Can pt return to current living arrangement?: Yes Admission Status: Voluntary Is patient capable of signing voluntary admission?: Yes Transfer from: Acute Hospital Referral Source: MD     Risk to self Suicidal Ideation: No Suicidal Intent: No Is patient at risk for suicide?: No Suicidal Plan?: No Access to Means: No What has been your use of drugs/alcohol within the last 12 months?: NA Previous Attempts/Gestures:  No How many times?: 0  Other Self Harm Risks: NA Triggers for Past Attempts: Unpredictable;Hallucinations Intentional Self Injurious Behavior: None Family Suicide History: Unknown Recent stressful life event(s): Turmoil (Comment) Persecutory voices/beliefs?: Yes Depression: Yes Depression Symptoms: Loss of interest in usual pleasures;Insomnia;Despondent;Isolating;Feeling worthless/self pity Substance abuse history and/or treatment for substance abuse?: No Suicide prevention information given to non-admitted patients: Not applicable  Risk to Others Homicidal Ideation: No Thoughts of Harm to Others: No Current Homicidal Intent: No Current Homicidal Plan: No Access to Homicidal Means: No Identified Victim: NA History of harm to others?: No Assessment of Violence: None Noted Violent Behavior Description: CALM, COOPERATIVE, QUITE, SLOW TO RESPOND Does patient have access to weapons?: No Criminal Charges Pending?: No Does patient have a court date: No  Psychosis Hallucinations: None noted Delusions: None noted  Mental Status Report Appear/Hygiene: Body odor;Disheveled;Poor hygiene Eye Contact: Poor Motor Activity: Freedom of movement;Psychomotor retardation Speech: Logical/coherent;Soft Level of Consciousness: Alert Mood: Depressed;Ambivalent;Anhedonia;Sad Affect: Appropriate to circumstance;Depressed;Sad Anxiety Level: None Thought Processes: Coherent;Relevant Judgement: Unimpaired Orientation: Person;Place;Time;Situation Obsessive Compulsive Thoughts/Behaviors: None  Cognitive Functioning Concentration: Decreased Memory: Recent Intact;Remote Intact IQ: Average Insight: Poor Impulse Control: Poor Appetite: Poor Weight Loss: 0  Weight Gain: 0  Sleep: Decreased Total Hours of Sleep: 0  Vegetative Symptoms: Not bathing;Decreased grooming  ADLScreening [facility] Assessment Services) [name] cognitive ability adequate to safely complete daily activities?: Yes Patient  able to express need for assistance with ADLs?: Yes Independently performs ADLs?: Yes  Abuse/Neglect [facility]) Physical Abuse: Denies Verbal Abuse: Denies Sexual Abuse: Denies  Prior Inpatient Therapy Prior Inpatient Therapy: Yes Prior  Therapy Dates: UNK Prior Therapy Facilty/Provider(s): UNK Reason for Treatment: UNK  Prior Outpatient Therapy Prior Outpatient Therapy: Yes Prior Therapy Dates: UNK Prior Therapy Facilty/Provider(s): UNK Reason for Treatment: UNK  ADL Screening (condition at time of admission) [name] cognitive ability adequate to safely complete daily activities?: Yes Patient able to express need for assistance with ADLs?: Yes Independently performs ADLs?: Yes       Abuse/Neglect Assessment (Assessment to be complete while patient is alone) Physical Abuse: Denies Verbal Abuse: Denies Sexual Abuse: Denies Values / Beliefs Cultural Requests During Hospitalization: None Spiritual Requests During Hospitalization: None     Nutrition Screen Diet: Regular (pt is eating dinner in his room)  Additional Information 1:1 In Past 12 Months?: No CIRT Risk: No Elopement Risk: No Does patient have medical clearance?: Yes     Disposition: Patient accepted by Dr. Theotis Barrio to the Service of Dr. Allena Katz. He is assigned to room 400-01. He is IVC and will be transported by [employer]. Support paperwork and authorization through [employer] completed and distributed.  Dr Estell Harpin is in agreement with this disposition. Disposition Disposition of Patient: Inpatient treatment program;Referred to Type of inpatient treatment program: Adult Patient referred to:  [facility])  On Site Evaluation by:   Reviewed with Physician:     Jearld Pies 02/26/2012 10:00 AM

## 2012-02-26 NOTE — ED Notes (Signed)
[facility] called for transport to [facility]. 

## 2012-02-26 NOTE — Tx Team (Signed)
Initial Interdisciplinary Treatment Plan  PATIENT STRENGTHS: (choose at least two) Communication skills Physical Health  PATIENT STRESSORS: Marital or family conflict Medication change or noncompliance   PROBLEM LIST: Problem List/Patient Goals Date to be addressed Date deferred Reason deferred Estimated date of resolution  Paranoia      Psychosis                                                 DISCHARGE CRITERIA:  Need for constant or close observation no longer present Verbal commitment to aftercare and medication compliance  PRELIMINARY DISCHARGE PLAN: Attend aftercare/continuing care group Outpatient therapy  PATIENT/FAMIILY INVOLVEMENT: This treatment plan has been presented to and reviewed with the patient, Gregory Jackson, and/or family member, .  The patient and family have been given the opportunity to ask questions and make suggestions.  Noah Charon 02/26/2012, 4:33 PM

## 2012-02-26 NOTE — Progress Notes (Signed)
1610 Patient has been accepted at [facility] pending a 400 bed. No behavioral issues overnight.

## 2012-02-26 NOTE — ED Notes (Signed)
Spoke with EMD re: results showing UTI. Rx for Cipro printed to send with pt and Culture ordered.

## 2012-02-26 NOTE — Progress Notes (Signed)
Patient cooperative during admission. Patient states "I have been off my medicines for one week." Patient verbalizes when he stops taking his medications he "stays away from home." Patient states he was "staying outside to keep from going back home." Patient lives with Father who is "somewhat supportive," according to patient. Patient denies SI/HI, denies A/V hallucinations. Patient has strong body odor and clothing is visibly soiled. Patient oriented to unit, patient verbalizes understanding. Patient safe on unit with Q15 minute checks for safety. Will continue to monitor.

## 2012-02-26 NOTE — BHH Counselor (Signed)
The patient has been accepted to the service of Dr Allena Katz. He has now received a bed assignment of 400-01. [facility] for authorization. Spoke with Burnetta Sabin, who informed us that the medicaid was not effective. We then spoke with Melanie P. Who authorized a 3 day stay starting 8/14 with review on 02/28/12. The authorization # is [id number]. Patient will be transported by [facility]. Support paperwork and authorization completed and distributed.

## 2012-02-26 NOTE — ED Notes (Signed)
Pt still sleeping. Deputy remains at bedside.

## 2012-02-26 NOTE — Progress Notes (Signed)
Did not attend wrap up group

## 2012-02-26 NOTE — Progress Notes (Signed)
D: Patient in room in bed on approach. Patient forwards little and does not engage in conversation.  Patient states, "I am just upset."  Would not offer any additional information.  Patient denies Depression but states he is sad.  Patient states, "I am tired and I just want to sleep." Patient denies SI/HI and denies AVH.  A: Writer offered encouragement and support. Staff to monitor Q 15 mins for safety. Scheduled medications administered per oreders R: Patient remains safe on the unit. Patient did not attend wrap up group tonight. Patient taking administered medications. Patient has not been interacting with in the milieu.

## 2012-02-27 DIAGNOSIS — Z9119 Patient's noncompliance with other medical treatment and regimen: Secondary | ICD-10-CM

## 2012-02-27 DIAGNOSIS — F200 Paranoid schizophrenia: Principal | ICD-10-CM

## 2012-02-27 DIAGNOSIS — Z91199 Patient's noncompliance with other medical treatment and regimen due to unspecified reason: Secondary | ICD-10-CM

## 2012-02-27 LAB — URINE CULTURE

## 2012-02-27 MED ORDER — CIPROFLOXACIN HCL 250 MG PO TABS
250.0000 mg | ORAL_TABLET | ORAL | Status: DC
  Administered 2012-02-27 – 2012-03-02 (×8): 250 mg via ORAL
  Filled 2012-02-27: qty 1
  Filled 2012-02-27: qty 9
  Filled 2012-02-27 (×8): qty 1
  Filled 2012-02-27: qty 9
  Filled 2012-02-27: qty 1

## 2012-02-27 MED ORDER — TRAZODONE HCL 50 MG PO TABS
150.0000 mg | ORAL_TABLET | Freq: Every day | ORAL | Status: DC
  Administered 2012-02-27 – 2012-03-01 (×4): 150 mg via ORAL
  Filled 2012-02-27 (×2): qty 1
  Filled 2012-02-27: qty 42
  Filled 2012-02-27 (×3): qty 1

## 2012-02-27 NOTE — Progress Notes (Signed)
D: Patient in room and has had minimal interaction within the milieu. Patient rates depression 4/10 tonight.  Patient has remained isolative and sad.  Patient states while here he has learned that he needs to take hi medications and he laughed while saying this.  Patient denies SI/HI and denies AVH.  A:  Encouragement and support offered.  Staff to monitor Q 15 mins for safety. Scheduled medications administered per orders. R: Patient remains safe on the unit. Taking administered medications.  Patient did not attend karaoke tonight.

## 2012-02-27 NOTE — Progress Notes (Signed)
Psychoeducational Group Note  Date:  02/27/2012 Time:  2000  Group Topic/Focus:  karaoke  Participation Level: Did Not Attend  Participation Quality:  Not Applicable  Affect:  Not Applicable  Cognitive:  Not Applicable  Insight:  Not Applicable  Engagement in Group: Not Applicable  Additional Comments:  Pt did not want to attend karaoke this evening.  Kaleen Odea R 02/27/2012, 8:57 PM

## 2012-02-27 NOTE — Progress Notes (Signed)
[facility] Group Notes:  (Counselor/Nursing/MHT/Case Management/Adjunct)  02/27/2012 2:40 PM  Type of Therapy:  Music Therapy  Participation Level:  None  Participation Quality:  Patient slept through group.   Gregory Jackson, Aram Beecham 02/27/2012, 2:40 PM

## 2012-02-27 NOTE — Progress Notes (Signed)
[facility] Group Notes:  (Counselor/Nursing/MHT/Case Management/Adjunct)  02/27/2012 2:38 PM  Type of Therapy:  Group Therapy  Participation Level:  Minimal   Participation Quality:  Attentive  Affect:  Depressed  Cognitive:  Oriented  Insight:  Limited  Engagement in Group:  Limited  Engagement in Therapy:  Limited  Modes of Intervention:  Education and Support  Summary of Progress/Problems: Patient was attentive but did not participate   Veto Kemps 02/27/2012, 2:38 PM

## 2012-02-27 NOTE — Discharge Planning (Signed)
02/27/2012  SW met with Gregory Jackson in discharge planning group.  SW found Gregory Jackson has current service providers, and they are [facility] in [place].  SW contacted [facility] to coordinate and schedule follow-up appointments.  SW will continue to assess for referrals.  Clarice Pole, LCASA 02/27/2012, 2:44 PM

## 2012-02-27 NOTE — Progress Notes (Signed)
Psychoeducational Group Note  Date:  02/27/2012 Time:  1100  Group Topic/Focus:  Self Esteem Action Plan:   The focus of this group is to help patients create a plan to continue to build self-esteem after discharge.  Participation Level:  Active  Participation Quality:  Appropriate and Sharing  Affect:  Flat  Cognitive:  Appropriate  Insight:  Good  Engagement in Group:  Good  Additional Comments:  none  Demarie Uhlig, Genia Plants 02/27/2012, 12:07 PM

## 2012-02-27 NOTE — Treatment Plan (Signed)
.  Interdisciplinary Treatment Plan Update (Adult) Date: 02/27/2012  Time Reviewed: 10:40 AM  Progress in Treatment: Attending groups: Yes Participating in groups: Yes Taking medication as prescribed: Yes Tolerating medication: Yes Family/Significant othe contact made: Contact to be made to pt. Father by counselor Patient understands diagnosis: Yes Discussing patient identified problems/goals with staff: Yes Medical problems stabilized or resolved: Yes Denies suicidal/homicidal ideation: Yes Issues/concerns per patient self-inventory: None identified Other: N/A New problem(s) identified: None Identified Reason for Continuation of Hospitalization: Anxiety Depression Medication stabilization Paranoia Interventions implemented related to continuation of hospitalization: mood stabilization, medication monitoring and adjustment, group therapy and psycho education, safety checks q 15 mins Additional comments: N/A Estimated length of stay:  Discharge Plan: SW is assessing for appropriate referrals.  New goal(s): N/A Review of initial/current patient goals per problem list:  1. Goal(s): Reduce depressive symptoms  Met: No  Target date: by discharge  As evidenced by: Reducing depression from a 10 to a 3 as reported by pt.  2. Goal (s): Medication Stabilization  Met: No  Target date: by discharge  As evidenced by: Per pt report and observation of treatment team.    3. Goal(s): Reduce Psychosis  Met: No  Target date: by discharge  As evidenced by: Reduce psychotic symptoms to baseline, as reported by pt.  4. Goal(s):  Address Follow-up   Met: No  Target date: by discharge  As evidenced by:  Confirmation of plan and appointments.  Pt has ACTT services at [facility]. Attendees: Patient: Gregory Jackson  02/27/2012  Family:    Physician: Franchot Gallo, MD 02/27/2012 10:40 AM   Nursing:    Case Manager: Clarice Pole, LCASA 02/27/2012 10:40 AM   Counselor: Veto Kemps, MT-BC 02/27/2012 10:40 AM   Other:  02/27/2012 10:40 AM   Other:    Other:    Other:    Scribe for Treatment Team:  Clarice Pole, LCASA 02/27/2012 10:40 AM
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
## 2012-02-27 NOTE — Progress Notes (Signed)
Patient ID: Gregory Jackson, male   DOB: [birth year], 36 y.o.   MRN: [id number]   D: Patient has a flat affect on approach today. States that he is about a "5" on depression scale and "3" on hopelessness scale. Currently denies any SI at this time. Denies hearing any voices at present. Reports slept well during the night and states appetite good. Energy level remains low and his ability to pay attention is improving. Patient reportedly is isolative and guarded around others since admission.  A: Staff will monitor on q 15 minute checks and RN will follow medication and treatment orders. R: Patient took am medications without issue this am. Answering questions appropriately. No issues on the unit today.
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
## 2012-02-28 NOTE — Progress Notes (Signed)
D) Pt is withdrawn and stays in his room for much of the shift. Very slow to respond with his answers. States that he slept last night. Denies SI, HI, delusions and hallucinations. Denies feeling like a curse has been put on him at this time. Wants to live in [place] with his father. Unknown how he will travel there. A) Given support. Encouraged to go to groups and to interact with his peers.  R) Remains quiet, and stays to himself.

## 2012-02-28 NOTE — Progress Notes (Signed)
[facility] Group Notes:  (Counselor/Nursing/MHT/Case Management/Adjunct)  02/28/2012 2:04 PM  Type of Therapy:  Group Therapy  Participation Level:  Minimal  Participation Quality:  Attentive and Sharing  Affect:  Depressed  Cognitive:  Oriented  Insight:  Limited  Engagement in Group:  Limited  Engagement in Therapy:  Limited  Modes of Intervention:  Education, Problem-solving, Support and Exploration  Summary of Progress/Problems: Patient does not initiate interactions. When questioned about relapse prevention, he stated that he should keep taking his medications. He couldn't really identify why he stops taking his medicine except that father is constantly yelling at him and asking him about his medications. He stated that this made him want to do the opposite.    Reeder Brisby, Aram Beecham 02/28/2012, 2:04 PM

## 2012-02-28 NOTE — Progress Notes (Signed)
Psychoeducational Group Note  Date:  02/28/2012 Time:  2000  Group Topic/Focus:  Wrap-Up Group:   The focus of this group is to help patients review their daily goal of treatment and discuss progress on daily workbooks.  Participation Level:  Minimal  Participation Quality:  Appropriate  Affect:  Appropriate  Cognitive:  Appropriate  Insight:  Limited  Engagement in Group:  Limited  Additional Comments:  Pt attended wrap-up group. Pt didn't share much in group. Pt stated he had a good day.   Bianna Haran A 02/28/2012, 10:13 PM

## 2012-02-28 NOTE — Progress Notes (Addendum)
SW met with pt during treatment team on this date.  Pt rates depression and anxiety at a 1-2 today.  Pt denies SI/HI.  Pt denies A/V hallucinations today.  Pt presents with flat affect and depressed mood.  Pt states that he can return home with his dad in [place].  Pt doesn't know how he will get home.  Pt states that he has an ACT team, and states [facility] and [place] visit him but doesn't know the agency name.  SW looked at last AVS from admission and pt is seen by [facility] for ACTT [phone]).  SW left a voicemail message for [facility] to schedule follow up with no return call at this time.  No further needs voiced by pt at this time.    Reyes Ivan, LCSWA 02/28/2012  10:49 AM

## 2012-02-28 NOTE — Progress Notes (Signed)
[facility] MD Progress Note  02/28/2012 3:02 PM  Diagnosis:  Axis I: Schizophrenia - Paranoid Type.   The patient was seen today and reports the following:   ADL's: Intact.  Sleep: The patient reports to sleeping well last night without difficulty.  Appetite: The patient reports a fair appetite today.   Mild>(1-10) >Severe  Hopelessness (1-10): 1-2  Depression (1-10): 1-2  Anxiety (1-10): 1-2   Suicidal Ideation: The patient denies any suicidal ideations today.  Plan: No  Intent: No  Means: No   Homicidal Ideation: The patient denies any homicidal ideations today.  Plan: No  Intent: No.  Means: No   General Appearance/Behavior: The patient remained cooperative today with this provider but again with little spontaneous speech.  Eye Contact: Good.  Speech: Appropriate in rate but with decreased volume with no pressuring of speech noted today.  Motor Behavior: wnl.  Level of Consciousness: Alert and Oriented x 3.  Mental Status: Alert and Oriented x 3.  Mood: Appears moderately depressed.  Affect: Appears moderately constricted.  Anxiety Level: Mild anxiety reported today.  Thought Process: wnl  Thought Content: The patient denies any auditory or visual hallucinations today. He also denies any delusional thinking today.  Perception: wnl  Judgment: Fair.  Insight: Fair.  Cognition: Oriented to person, place and time.  Sleep:  Number of Hours: 6.75    Vital Signs:Blood pressure 107/69, pulse 88, temperature 97.9 F (36.6 C), temperature source Oral, resp. rate 16, height 6\' 4"  (1.93 m), weight 86.183 kg (190 lb).  Current Medications: Current Facility-Administered Medications  Medication Dose Route Frequency Provider Last Rate Last Dose  . acetaminophen (TYLENOL) tablet 650 mg  650 mg Oral Q6H PRN Curlene Labrum Pj Zehner, MD      . alum & mag hydroxide-simeth (MAALOX/MYLANTA) 200-200-20 MG/5ML suspension 30 mL  30 mL Oral Q4H PRN Curlene Labrum Saranya Harlin, MD      . ciprofloxacin (CIPRO)  tablet 250 mg  250 mg Oral BH-qamhs Caspar Favila D Xerxes Agrusa, MD   250 mg at 02/28/12 0831  . haloperidol (HALDOL) tablet 5 mg  5 mg Oral BH-qamhs Curlene Labrum Nijee Heatwole, MD   5 mg at 02/28/12 0831  . magnesium hydroxide (MILK OF MAGNESIA) suspension 30 mL  30 mL Oral Daily PRN Curlene Labrum Dylon Correa, MD      . potassium chloride (K-DUR,KLOR-CON) CR tablet 10 mEq  10 mEq Oral BH-qamhs Curlene Labrum Eavan Gonterman, MD   10 mEq at 02/28/12 0831  . traZODone (DESYREL) tablet 150 mg  150 mg Oral QHS Curlene Labrum Jakob Kimberlin, MD   150 mg at 02/27/12 2145  . DISCONTD: ciprofloxacin (CIPRO) tablet 250 mg  250 mg Oral BID Curlene Labrum Felita Bump, MD   250 mg at 02/27/12 0802  . DISCONTD: traZODone (DESYREL) tablet 100 mg  100 mg Oral QHS Curlene Labrum Teyon Odette, MD   100 mg at 02/26/12 2141   Lab Results: No results found for this or any previous visit (from the past 48 hour(s)).  Physical Findings: AIMS: Facial and Oral Movements Muscles of Facial Expression: None, normal Lips and Perioral Area: None, normal Jaw: None, normal Tongue: None, normal,Extremity Movements Upper (arms, wrists, hands, fingers): None, normal Lower (legs, knees, ankles, toes): None, normal, Trunk Movements Neck, shoulders, hips: None, normal, Overall Severity Severity of abnormal movements (highest score from questions above): None, normal Incapacitation due to abnormal movements: None, normal [name] awareness of abnormal movements (rate only patient's report): No Awareness, Dental Status Current problems with teeth and/or dentures?: No Does patient  usually wear dentures?: No  CIWA:  CIWA-Ar Total: 0  COWS:  COWS Total Score: 0   Review of Systems:  Neurological: No headaches, seizures or dizziness reported.  G.I.: The patient denies any constipation or stomach upset today.  Musculoskeletal: The patient denies any musculoskeletal issues today.   Time was spent today discussing with the patient his current symptoms. The patient reports to sleeping well and reports a  good appetite. He appears to be experiencing moderate feeling of sadness, anhedonia and depressed mood but reports minimal symptoms.  He denies any suicidal or homicidal ideations. The patient reports mild anxiety symptoms today and denies any auditory or visual hallucinations or delusional thinking.  The patient again states that he is unsure where he will be living at discharge.  Treatment Plan Summary:  1. Daily contact with patient to assess and evaluate symptoms and progress in treatment.  2. Medication management  3. The patient will deny suicidal ideations or homicidal ideations for 48 hours prior to discharge and have a depression and anxiety rating of 3 or less. The patient will also deny any auditory or visual hallucinations or delusional thinking.  4. The patient will deny any symptoms of substance withdrawal at time of discharge.   Plan:  1. Will continue the medication Haldol at 5 mgs po q am and hs to continue to address the patients psychotic symptoms.  2. Will continue the medication Trazodone at 150 mgs po qhs for sleep.  3. Will continue the medication KCL at 10 mEqs po q am and hs x 6 doses for low potassium.  4. Will continue the medication Cipro 250 mgs po q am and hs x 7 days for his UTI.  5. Laboratory Studies reviewed.  6. Will continue to monitor.   Lajuan Kovaleski 02/28/2012, 3:02 PM

## 2012-02-28 NOTE — Progress Notes (Signed)
Patient ID: Gregory Jackson, male   DOB: 02-02-1976, 36 y.o.   MRN: [id number] Attempted to call patient's father-Pete [phone]). Unable to leave a message.

## 2012-02-28 NOTE — Progress Notes (Signed)
02/28/2012         Time: 0930      Group Topic/Focus: The focus of the group is on enhancing the patients' ability to cope with stressors by understanding what coping is, why it is important, the negative effects of stress and developing healthier coping skills. Patients practice [place] and discuss how exercise can be used as a healthy coping strategy.  Participation Level: Active  Participation Quality: Attentive  Affect: Blunted  Cognitive: Oriented   Additional Comments: Patient participates in QiGong every admission, reports it is an activity he finds relaxing. Patient otherwise quiet, withdrawn during processing. Patient provided with Friday workbook for unit programming.    Shyne Resch 02/28/2012 11:41 AM

## 2012-02-28 NOTE — Tx Team (Signed)
Interdisciplinary Treatment Plan Update (Adult)  Date:  02/28/2012  Time Reviewed:  10:18 AM   Progress in Treatment: Attending groups: Yes Participating in groups:  Yes Taking medication as prescribed: Yes Tolerating medication:  Yes Family/Significant othe contact made:  Counselor assessing for appropriate contact Patient understands diagnosis:  Yes Discussing patient identified problems/goals with staff:  Yes Medical problems stabilized or resolved:  Yes Denies suicidal/homicidal ideation: Yes Issues/concerns per patient self-inventory:  None identified Other: N/A  New problem(s) identified: None Identified  Reason for Continuation of Hospitalization: Anxiety Depression Medication stabilization   Interventions implemented related to continuation of hospitalization: mood stabilization, medication monitoring and adjustment, group therapy and psycho education, safety checks q 15 mins  Additional comments: N/A  Estimated length of stay: 3-5 days  Discharge Plan: SW is assessing for appropriate referrals.   New goal(s): N/A  Review of initial/current patient goals per problem list:   1.  Goal(s): Reduce depressive and anxiety symptoms  Met:  No  Target date: by discharge  As evidenced by: Reducing depression from a 10 to a 3 as reported by pt.   2.  Goal (s): Eliminate Suicidal Ideation  Met:  No  Target date: by discharge  As evidenced by: Eliminate suicidal ideation.   3.  Goal(s): Reduce Psychosis  Met:  No  Target date: by discharge  As evidenced by: Reduce psychotic symptoms to baseline, as reported by pt.    4.  Goal(s): Stabilize pt on medications  Met:  No  Target date: by discharge  As evidenced by: Pt taking meds regularly and returning to baseline.     Attendees: Patient:  Gregory Jackson 02/28/2012 10:20 AM   Family:     Physician:  Franchot Gallo, MD 02/28/2012 10:18 AM   Nursing:   Shelda Jakes, RN 02/28/2012 10:20 AM   Case Manager:   Reyes Ivan, LCSWA 02/28/2012 10:18 AM   Counselor:  Veto Kemps, MT-BC 02/28/2012 10:18 AM   Other: Lamount Cranker, RN 02/28/2012 10:43 AM   Other:     Other:     Other:      Scribe for Treatment Team:   Carmina Miller, 02/28/2012, 10:18 AM

## 2012-02-29 DIAGNOSIS — E876 Hypokalemia: Secondary | ICD-10-CM | POA: Diagnosis present

## 2012-02-29 NOTE — Progress Notes (Signed)
[facility] Group Notes:  (Counselor/Nursing/MHT/Case Management/Adjunct)  02/29/2012 6:16 PM  Type of Therapy:  Group Therapy  Participation Level:  Minimal  Participation Quality:  Pt initially spoke in group and then became catonic like  Affect:  Flat  Cognitive:  Confused  Insight:  None  Engagement in Group:  None  Engagement in Therapy:  None  Modes of Intervention:  Problem-solving, Support and development of coping skills.   Summary of Progress/Problems: Pt came to group and initially shared that his aftercare plans include resuming [facility] services "if they will take him back and come out to see him". After making this statement pt did not speak in group instead stared at the coping skills packet given to him by counselor. Pt did not respond to direct questions either  Berlin Hun, MSW, LCSW 02/29/2012, 6:16 PM

## 2012-02-29 NOTE — Progress Notes (Signed)
[facility] MD Progress Note                                         02/29/2012    Gregory Jackson 1976/04/06    [id number]/0400-01 Hospital day #3  1. Schizophrenia, paranoid type    The patient was seen today and reports the following:  Sleep: fairly good Appetite: not so good, not hungry  Mild>(1-10) >Severe  Depression (1-10): 0 Anxiety (1-10): 3 Hopelessness (1-10): 0   Suicidal Ideation: the patient denies suicidal ideation. Plan: None Intent: None Means:  None  Homicidal Ideation: the patient denies homicidal ideation. Plan: None Intent: None Means: None  Eye Contact: Good.  General Appearance: casual appearance Behavior:  cooperative Motor Behavior: normal Speech: voice is soft and quiet  Mental Status:  Orientation x 3 Level of Consciousness:   alert Mood: depressed Affect: flat   Thought Process: linear Thought Content:  Denies AH/VH Perception: intact  Judgment: poor Insight: poor Cognition: at least average  VS: height is 6\' 4"  (1.93 m) and weight is 86.183 kg (190 lb). His oral temperature is 98.4 F (36.9 C). His blood pressure is 110/72 and his pulse is 87. His respiration is 16.  Current Medication:   . ciprofloxacin  250 mg Oral BH-qamhs  . haloperidol  5 mg Oral BH-qamhs  . potassium chloride  10 mEq Oral BH-qamhs  . traZODone  150 mg Oral QHS    Lab results:No results found for this or any previous visit (from the past 48 hour(s)).  No results found for this or any previous visit for  Last 48 hours. ROS:    Constitutional: WDWN Adult in NAD   GI: Negative for N,V,D,C   Neuro: Negative for dizziness, blurred vision, visual changes, headaches   Resp: Negative for wheezing, SOB, cough   Cardio: Negative for CP, diaphoresis, fatigue   MSK: Negative for joint pain, swelling, DROM, or ambulatory difficulties.  Time was spent with the patient discussing the current symptoms and the response to treatment.  [name] was quiet but cooperative. States he  is attending all groups. Has not contacted his father, doesn't feel he will get anything out of calling him.  No new complaints.  He is up and active in the milieu. PLAN: 1. Daily contact with patient to assess and evaluate symptoms and progress in treatment.  2. Medication management  3. The patient will deny suicidal ideations or homicidal ideations for 48 hours prior to discharge and have a depression and anxiety rating of 3 or less. The patient will also deny any auditory or visual hallucinations or delusional thinking.  4. The patient will deny any symptoms of substance withdrawal at time of discharge.  5. Will order CMP to follow K as it was low previously.  Rona Ravens. [name] PAC 8/17/2013time@

## 2012-02-29 NOTE — Progress Notes (Signed)
Psychoeducational Group Note  Date:  02/29/2012 Time:1000am  Group Topic/Focus:  Identifying Needs:   The focus of this group is to help patients identify their personal needs that have been historically problematic and identify healthy behaviors to address their needs.  Participation Level:  Minimal  Participation Quality:  Inattentive  Affect:  Anxious  Cognitive:  Appropriate  Insight:  Limited  Engagement in Group:  Limited  Additional Comments:    Valente Lamere 02/29/2012,11:22 AM

## 2012-02-29 NOTE — Progress Notes (Signed)
D patient slept okay last nite, eating meals in the DR and enjoying the food, denies SI or HI, taking meds as ordered by MD, attending groups and having minimal participating,  denies AV/H, friendly and cooperative w/staff and peers, voicing no complaints at this time, depressed mood, concerned about where he will be going at discharge A q30min safety checks continue and support offered, encouraged him to talk more to case manager about where to go upon discharge R patient remains safe on the unit

## 2012-02-29 NOTE — Progress Notes (Signed)
Psychoeducational Group Note  Date:  02/29/2012 Time:  2000  Group Topic/Focus:  Wrap-Up Group:   The focus of this group is to help patients review their daily goal of treatment and discuss progress on daily workbooks.  Participation Level:  Minimal  Participation Quality:  Appropriate  Affect:  Appropriate  Cognitive:  Appropriate  Insight:  Good  Engagement in Group:  Good  Additional Comments:  Patient attended and participated in group tonight. He reported that he got some rest today, he eat well, and attended group.  Patient reports that he cope by listening, being respectful and just letting the day pass.   Lita Mains [facility] 02/29/2012, 10:08 PM

## 2012-02-29 NOTE — Progress Notes (Signed)
Patient ID: Gregory Jackson, male   DOB: 1976-01-15, 36 y.o.   MRN: [id number] Was pleasant and cooperative this evening, attended group, watched some tv while having a snack.  Came to med window afterward for hs meds.  Currently denies c/o's discomfort, denies SI/HI.   Does seem to be internally focused at times, but denies voices.  Will continue to monitor for safety.

## 2012-03-01 LAB — COMPREHENSIVE METABOLIC PANEL
ALT: 21 U/L (ref 0–53)
AST: 19 U/L (ref 0–37)
Albumin: 3.5 g/dL (ref 3.5–5.2)
CO2: 28 mEq/L (ref 19–32)
Chloride: 105 mEq/L (ref 96–112)
GFR calc non Af Amer: >90 mL/min (ref >90)
Sodium: 138 mEq/L (ref 135–145)
Total Bilirubin: 0.3 mg/dL (ref 0.3–1.2)

## 2012-03-01 NOTE — Progress Notes (Signed)
[facility] MD Progress Note                                         03/01/2012    Gregory Jackson 1976-01-01    [id number]/0400-01 Hospital day #4  1. Schizophrenia, paranoid type   2. Hypokalemia   The patient was seen today and reports the following:  Sleep: good Appetite: good  Mild>(1-10) >Severe  Depression (1-10):  1/10 Anxiety (1-10):  2-3 Hopelessness (1-10):  0   Suicidal Ideation: the patient denies suicidal ideation. Plan: None Intent: None Means:  None  Homicidal Ideation: the patient denies homicidal ideation. Plan: None Intent: None Means: None  Eye Contact:  good General Appearance:   casual Behavior:  Cooperative  Motor Behavior: normal Speech:  Soft clear  Mental Status:  Orientation x  2/3 Level of Consciousness:   alert Mood: euthymic Affect:congruent   Thought Process: linear Thought Content: denies AH/VH Perception: impaired  Judgment: fair Insight:  fair Cognition:  average  VS: height is 6\' 4"  (1.93 m) and weight is 86.183 kg (190 lb). His oral temperature is 98 F (36.7 C). His blood pressure is 111/77 and his pulse is 52. His respiration is 18.  Current Medication:   . ciprofloxacin  250 mg Oral BH-qamhs  . haloperidol  5 mg Oral BH-qamhs  . traZODone  150 mg Oral QHS    Lab results: Results for orders placed during the hospital encounter of 02/26/12 (from the past 48 hour(s))  COMPREHENSIVE METABOLIC PANEL     Status: Normal   Collection Time   03/01/12  6:47 AM      Component Value Range Comment   Sodium 138  135 - 145 mEq/L    Potassium 4.2  3.5 - 5.1 mEq/L    Chloride 105  96 - 112 mEq/L    CO2 28  19 - 32 mEq/L    Glucose, Bld 91  70 - 99 mg/dL    BUN 12  6 - 23 mg/dL    Creatinine, Ser 6.57  0.50 - 1.35 mg/dL    Calcium 9.3  8.4 - 84.6 mg/dL    Total Protein 6.0  6.0 - 8.3 g/dL    Albumin 3.5  3.5 - 5.2 g/dL    AST 19  0 - 37 U/L    ALT 21  0 - 53 U/L    Alkaline Phosphatase 89  39 - 117 U/L    Total Bilirubin 0.3  0.3 -  1.2 mg/dL    GFR calc non Af Amer >90  >90 mL/min    GFR calc Af Amer >90  >90 mL/min     Results reviewed. ROS:    Constitutional: WDWN Adult in NAD   GI: Negative for N,V,D,C   Neuro: Negative for dizziness, blurred vision, visual changes,headaches   Resp: Negative for wheezing, SOB, cough   Cardio: Negative for CP, diaphoresis, fatigue   MSK: Negative for joint pain, swelling, DROM, or ambulatory difficulties.  Time was spent with the patient discussing the current symptoms and the response to treatment.  Treatment Summary 1. Daily contact with patient to assess and evaluate symptoms and progress in treatment.  2. Medication management  3. The patient will deny suicidal ideations or homicidal ideations for 48 hours prior to discharge and have a depression and anxiety rating of 3 or less. The patient will also deny any  auditory or visual hallucinations or delusional thinking.  4. The patient will deny any symptoms of substance withdrawal at time of discharge.   Treatment Plan: 1. Will continue the medication Haldol at 5 mgs po q am and hs to continue to address the patients psychotic symptoms.  2. Will continue the medication Trazodone at 150 mgs po qhs for sleep.  3. Will continue the medication KCL at 10 mEqs po q am and hs x 6 doses for low potassium.  4. Will continue the medication Cipro 250 mgs po q am and hs x 7 days for his UTI.  5. Laboratory Studies reviewed.  6. Will continue to monitor.    Gregory Jackson [facility] 03/01/2012

## 2012-03-01 NOTE — Progress Notes (Signed)
D: Patient denies SI. Patient has a depressed mood and a blunted affect. The patient refused to rate his depression and hopelessness. The patient reports sleeping poorly and has a low energy level but a good appetite. The patient is cooperative and appropriate on the unit. The patient attends groups regularly but has minimal participation.  A: Patient given emotional support from RN. Patient encouraged to come to staff with concerns and/or questions. [name] medication routine continued. [name] orders and plan of care reviewed.  R: Patient remains appropriate and cooperative. Will continue to monitor patient q15 minutes for safety.

## 2012-03-01 NOTE — Progress Notes (Signed)
Psychoeducational Group Note  Date:  03/01/2012 Time:  1000  Group Topic/Focus:  Spirituality:   The focus of this group is to discuss how one's spirituality can aide in recovery.  Participation Level:  Minimal  Participation Quality:  Appropriate and Redirectable  Affect:  Blunted  Cognitive:  Alert  Insight:  Limited  Engagement in Group:  Limited  Additional Comments:  Patient had minimal interaction and seemed preoccupied but was easily redirectable when called upon.  Nestor Ramp [facility] 03/01/2012, 10:51 AM

## 2012-03-01 NOTE — Progress Notes (Signed)
Patient ID: Gregory Jackson, male   DOB: [birth year], 36 y.o.   MRN: [id number]   [facility] Group Notes:  (Counselor/Nursing/MHT/Case Management/Adjunct)  03/01/2012 11 AM  Type of Therapy:  Aftercare Planning, Group Therapy, Dance/Movement Therapy   Participation Level:  None  Participation Quality:  Appropriate  Affect:  Appropriate  Cognitive:  Appropriate  Insight:  Limited  Engagement in Group:  None  Engagement in Therapy:  None  Modes of Intervention:  Clarification, Problem-solving, Role-play, Socialization and Support  Summary of Progress/Problems: After Care: Pt did not attend aftercare planning group.  Counseling:  Therapist and group members discussed healthy support systems, different types of support, and how to ask for help. Therapists modeled healthy and unhealthy supports and group members discussed the differences.   Cassidi Long 03/01/2012. 11:33 AM

## 2012-03-01 NOTE — Progress Notes (Signed)
Psychoeducational Group Note  Date:  03/01/2012 Time:  2000  Group Topic/Focus:  Goals Group:   The focus of this group is to help patients establish daily goals to achieve during treatment and discuss how the patient can incorporate goal setting into their daily lives to aide in recovery.  Participation Level:  Minimal  Participation Quality:  Patient participated minimally and appeared to be slow to respond when ask questions  Affect:  Appropriate  Cognitive:  Appropriate  Insight:  Limited  Engagement in Group:  Limited  Additional Comments:  Patient stated that he had a good day because he was able to contact his aunt, which is apart of his support system.   Lyndee Hensen 03/01/2012, 9:08 PM

## 2012-03-02 MED ORDER — HALOPERIDOL 5 MG PO TABS: 5.0000 mg | ORAL_TABLET | ORAL | Status: AC

## 2012-03-02 MED ORDER — CIPROFLOXACIN HCL 250 MG PO TABS: 250.0000 mg | ORAL_TABLET | ORAL | Status: AC

## 2012-03-02 MED ORDER — TRAZODONE HCL 150 MG PO TABS: 150.0000 mg | ORAL_TABLET | Freq: Every day | ORAL | Status: DC

## 2012-03-02 NOTE — Progress Notes (Signed)
[facility] Adult Inpatient Family/Significant Other Suicide Prevention Education  Suicide Prevention Education:  Contact Attempts: Marthe Patch (father) [phone]) has been identified by the patient as the family member/significant other with whom the patient will be residing, and identified as the person(s) who will aid the patient in the event of a mental health crisis.  With written consent from the patient, two attempts were made to provide suicide prevention education, prior to and/or following the patient's discharge.  We were unsuccessful in providing suicide prevention education.  A suicide education pamphlet was given to the patient to share with family/significant other.  Date and time of first attempt: 02/21/12  10:00 am, 3:30 pm Date and time of second attempt: 02/24/12  12:35 Elyon Zoll, Aram Beecham 03/02/2012, 12:40 PM

## 2012-03-02 NOTE — Discharge Planning (Signed)
03/02/2012  SW met with Gregory Jackson in discharge planning group.  SW found Gregory Jackson to rate his depression at 1/10 and anxiety at 1-2/10 today.  Pt. would like to return home with his father when he discharges.  SW will continue to assess for referrals.  SW to contact [place] with [facility] to determine if he can contact pt. father and transport pt. Home today or tomorrow after contact is made with father.  Clarice Pole, LCASA 03/02/2012, 10:57 AM
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
## 2012-03-02 NOTE — Progress Notes (Signed)
Discharge Note:   Patient was picked up by his dad and will live with his dad.   Patient received all his belongings, medications, discharge instructions, clothing.   Patient received his white sneakers with strings, keys.  Patient denied SI & HI.   Denied A/V hallucinations.   Denied pain.  Patient stated he appreciated all the staff has done to assist him while at [facility].  Patient has been cooperative and pleasant.

## 2012-03-02 NOTE — Progress Notes (Signed)
[facility] Group Notes:  (Counselor/Nursing/MHT/Case Management/Adjunct)  03/02/2012 3:42 PM  Type of Therapy:  Group Therapy  Participation Level:  Minimal  Participation Quality:  Drowsy  Affect:  Flat  Cognitive:  Not enough interaction to assess   Insight:  Not enough interaction to assess  Engagement in Group:  Limited  Engagement in Therapy:  Limited  Modes of Intervention:  Education  Summary of Progress/Problems: Patient slept through most of session.   Gregory Jackson 03/02/2012, 3:42 PM

## 2012-03-02 NOTE — Progress Notes (Signed)
On patient's self inventory sheet, patient sleeps well, has good appetite, high energy level, good attention span.   Rated depression and hopelessness #1.  Has experienced cravings in past 24 hours.  Denied SI.  Pain goal today #1, worst pain #1.  After discharge, plans to sleep more, rest.  No questions for staff.  Does have discharge plans.  May have problems taking meds after discharge.  Plans to stay focused on meds after discharge.   Denied SI and HI while talking to nurse.  Denied A/V hallucinations.   Denied pain.

## 2012-03-02 NOTE — Progress Notes (Signed)
03/02/2012         Time: 0930      Group Topic/Focus: The focus of this group is on promoting emotional and psychological well-being through the process of creative expression, relaxation, socialization, fun and enjoyment.  Participation Level: Active  Participation Quality: Attentive  Affect: Blunted  Cognitive: Alert   Additional Comments: Patient remains flat, participates with encouragement.    Fleurette Woolbright 03/02/2012 10:09 AM

## 2012-03-04 NOTE — Progress Notes (Signed)
Patient Discharge Instructions:  After Visit Summary (AVS):   Faxed to:  03/04/2012 Psychiatric Admission Assessment Note:   Faxed to:  03/04/2012 Suicide Risk Assessment - Discharge Assessment:   Faxed to:  03/04/2012 Faxed/Sent to the Next Level Care provider:  03/04/2012  Faxed to [facility]; Dr. Caryn Section @ [phone]  Heloise Purpura Eduard Clos, 03/04/2012, 6:02 PM
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
## 2013-03-09 ENCOUNTER — Encounter (HOSPITAL_COMMUNITY): Payer: Self-pay

## 2013-03-09 ENCOUNTER — Emergency Department (HOSPITAL_COMMUNITY): Payer: Self-pay

## 2013-03-09 ENCOUNTER — Inpatient Hospital Stay (HOSPITAL_COMMUNITY)
Admission: EM | Admit: 2013-03-09 | Discharge: 2013-03-12 | DRG: 309 | Disposition: A | Discharge status: Home / Self Care | Payer: Medicaid Other | Attending: Internal Medicine | Admitting: Internal Medicine

## 2013-03-09 ENCOUNTER — Ambulatory Visit (HOSPITAL_COMMUNITY): Admit: 2013-03-09 | Payer: Self-pay | Admitting: Cardiovascular Disease

## 2013-03-09 ENCOUNTER — Encounter (HOSPITAL_COMMUNITY): Payer: Self-pay | Admitting: Cardiology

## 2013-03-09 DIAGNOSIS — R55 Syncope and collapse: Secondary | ICD-10-CM

## 2013-03-09 DIAGNOSIS — Z9119 Patient's noncompliance with other medical treatment and regimen: Secondary | ICD-10-CM

## 2013-03-09 DIAGNOSIS — I498 Other specified cardiac arrhythmias: Secondary | ICD-10-CM

## 2013-03-09 DIAGNOSIS — R9431 Abnormal electrocardiogram [ECG] [EKG]: Secondary | ICD-10-CM

## 2013-03-09 DIAGNOSIS — R001 Bradycardia, unspecified: Secondary | ICD-10-CM | POA: Diagnosis present

## 2013-03-09 DIAGNOSIS — E876 Hypokalemia: Secondary | ICD-10-CM

## 2013-03-09 DIAGNOSIS — I495 Sick sinus syndrome: Principal | ICD-10-CM | POA: Diagnosis present

## 2013-03-09 DIAGNOSIS — Z8249 Family history of ischemic heart disease and other diseases of the circulatory system: Secondary | ICD-10-CM

## 2013-03-09 DIAGNOSIS — F200 Paranoid schizophrenia: Secondary | ICD-10-CM

## 2013-03-09 DIAGNOSIS — W19XXXA Unspecified fall, initial encounter: Secondary | ICD-10-CM | POA: Diagnosis present

## 2013-03-09 DIAGNOSIS — Z91199 Patient's noncompliance with other medical treatment and regimen due to unspecified reason: Secondary | ICD-10-CM

## 2013-03-09 DIAGNOSIS — F17200 Nicotine dependence, unspecified, uncomplicated: Secondary | ICD-10-CM | POA: Diagnosis present

## 2013-03-09 HISTORY — DX: Bradycardia, unspecified: R00.1

## 2013-03-09 LAB — RAPID URINE DRUG SCREEN, HOSP PERFORMED
Barbiturates: NOT DETECTED
Benzodiazepines: NOT DETECTED
Cocaine: NOT DETECTED
Tetrahydrocannabinol: NOT DETECTED

## 2013-03-09 LAB — CBC WITH DIFFERENTIAL/PLATELET
Basophils Absolute: 0 10*3/uL (ref 0.0–0.1)
Basophils Relative: 0 % (ref 0–1)
Eosinophils Absolute: 0.1 10*3/uL (ref 0.0–0.7)
HCT: 46.8 % (ref 39.0–52.0)
MCH: 30.5 pg (ref 26.0–34.0)
MCHC: 35 g/dL (ref 30.0–36.0)
Monocytes Absolute: 0.6 10*3/uL (ref 0.1–1.0)
Neutro Abs: 13.8 10*3/uL — ABNORMAL HIGH (ref 1.7–7.7)
Neutrophils Relative %: 83 % — ABNORMAL HIGH (ref 43–77)
RDW: 13 % (ref 11.5–15.5)

## 2013-03-09 LAB — COMPREHENSIVE METABOLIC PANEL
AST: 33 U/L (ref 0–37)
Albumin: 3.9 g/dL (ref 3.5–5.2)
Calcium: 9.2 mg/dL (ref 8.4–10.5)
Chloride: 107 mEq/L (ref 96–112)
Creatinine, Ser: 1.21 mg/dL (ref 0.50–1.35)
Total Bilirubin: 0.3 mg/dL (ref 0.3–1.2)
Total Protein: 6.6 g/dL (ref 6.0–8.3)

## 2013-03-09 LAB — TROPONIN I
Troponin I: <0.3 ng/mL (ref <0.30)
Troponin I: <0.3 ng/mL (ref <0.30)

## 2013-03-09 SURGERY — LEFT HEART CATHETERIZATION WITH CORONARY ANGIOGRAM
Anesthesia: LOCAL

## 2013-03-09 MED ORDER — ONDANSETRON HCL 4 MG/2ML IJ SOLN: 4.0000 mg | Freq: Four times a day (QID) | INTRAMUSCULAR | Status: DC | PRN

## 2013-03-09 MED ORDER — SODIUM CHLORIDE 0.9 % IJ SOLN
3.0000 mL | Freq: Two times a day (BID) | INTRAMUSCULAR | Status: DC
  Administered 2013-03-10 – 2013-03-12 (×3): 3 mL via INTRAVENOUS

## 2013-03-09 MED ORDER — SODIUM CHLORIDE 0.9 % IV SOLN
INTRAVENOUS | Status: AC
  Administered 2013-03-09: 19:00:00 via INTRAVENOUS

## 2013-03-09 MED ORDER — ONDANSETRON HCL 4 MG PO TABS: 4.0000 mg | ORAL_TABLET | Freq: Four times a day (QID) | ORAL | Status: DC | PRN

## 2013-03-09 NOTE — ED Notes (Signed)
To ED via [facility]-- Medic 5-- from gas station-- pt states

## 2013-03-09 NOTE — ED Provider Notes (Signed)
CSN: [id number]     Arrival date & time 03/09/13  1409 History   First MD Initiated Contact with Patient 03/09/13 1427     Chief Complaint  Patient presents with  . Loss of Consciousness  . Chest Pain   HPI Pt was seen at 1405. Pt seen on arrival to ED exam room with [facility] Dr. Tresa Endo. Per EMS and pt report, pt c/o sudden onset and resolution of 2 episodes of syncope that occurred PTA. Syncope was preceded by feeling "lightheaded." Pt states he was outside pumping gas when he "fell and hit my head on the curb." Fire on scene stated to EMS pt's SBP was "90." Upon EMS arrival, pt was assisted to standing and when he attempted to walk he "passed out again." EMS felt pt's monitor may have had acute ST elevations and Code STEMI was called. EMS also noted pt's HR during the syncopal episode "dropped into the 40's." EMS states they gave pt IV NS 2L en route to the ED. Pt awoke after syncopal episode A&O and c/o vague "chest pain." No seizure activity was noted, no incont of bowel/bladder. On pt's arrival to ED, pt denies CP/palpitations. Pt also denies recent SOB/cough, no focal motor weakness, no tingling/numbness in extremities, no abd pain, no N/V/D, no fevers.      Past Medical History  Diagnosis Date  . Schizophrenia   . Bradycardia 03/09/2013   History reviewed. No pertinent past surgical history.  Family History  Problem Relation Age of Onset  . Heart disease Mother    History  Substance Use Topics  . Smoking status: Current Every Day Smoker -- 1.00 packs/day    Types: Cigarettes  . Smokeless tobacco: Never Used  . Alcohol Use: No    Review of Systems ROS: Statement: All systems negative except as marked or noted in the HPI; Constitutional: Negative for fever and chills. ; ; Eyes: Negative for eye pain, redness and discharge. ; ; ENMT: Negative for ear pain, hoarseness, nasal congestion, sinus pressure and sore throat. ; ; Cardiovascular: +CP. Negative for palpitations, diaphoresis,  dyspnea and peripheral edema. ; ; Respiratory: Negative for cough, wheezing and stridor. ; ; Gastrointestinal: Negative for nausea, vomiting, diarrhea, abdominal pain, blood in stool, hematemesis, jaundice and rectal bleeding. . ; ; Genitourinary: Negative for dysuria, flank pain and hematuria. ; ; Musculoskeletal: Negative for back pain and neck pain. Negative for swelling and trauma.; ; Skin: Negative for pruritus, rash, abrasions, blisters, bruising and skin lesion.; ; Neuro: Negative for headache and neck stiffness. Negative for weakness, altered mental status, extremity weakness, paresthesias, involuntary movement, seizure and +lightheadedness, syncope.     Allergies  Review of patient's allergies indicates no known allergies.  Home Medications  No current outpatient prescriptions on file. BP 111/59  Pulse 51 Physical Exam 1410: Physical examination:  Nursing notes reviewed; Vital signs and O2 SAT reviewed;  Constitutional: Well developed, Well nourished, Well hydrated, In no acute distress; Head:  Normocephalic, atraumatic; Eyes: EOMI, PERRL, No scleral icterus; ENMT: Mouth and pharynx normal, Mucous membranes moist; Neck: Supple, Full range of motion, No lymphadenopathy; Cardiovascular: Regular rate and rhythm, No murmur, rub, or gallop; Respiratory: Breath sounds clear & equal bilaterally, No rales, rhonchi, wheezes.  Speaking full sentences with ease, Normal respiratory effort/excursion; Chest: Nontender, Movement normal; Abdomen: Soft, Nontender, Nondistended, Normal bowel sounds; Genitourinary: No CVA tenderness; Extremities: Pulses normal, No tenderness, No edema, No calf edema or asymmetry.; Neuro: AA&Ox3, Major CN grossly intact. No facial droop. Speech clear.  No gross focal motor or sensory deficits in extremities.; Skin: Color normal, Warm, Dry.; Psych:  Affect flat.    ED Course  Procedures   MDM  MDM Reviewed: previous chart, nursing note and vitals Interpretation: ECG, labs,  x-ray and CT scan      Date: 03/09/2013  Rate: 50  Rhythm: normal sinus rhythm and sinus arrhythmia  QRS Axis: normal  Intervals: normal  ST/T Wave abnormalities: nonspecific T wave changes, TWI leads V4-V6  Conduction Disutrbances:none  Narrative Interpretation:   Old EKG Reviewed: none available  Results for orders placed during the hospital encounter of 03/09/13  GLUCOSE, CAPILLARY      Result Value Range   Glucose-Capillary 99  70 - 99 mg/dL  ETHANOL      Result Value Range   Alcohol, Ethyl (B) <11  0 - 11 mg/dL  CBC WITH DIFFERENTIAL      Result Value Range   WBC 16.6 (*) 4.0 - 10.5 K/uL   RBC 5.37  4.22 - 5.81 MIL/uL   Hemoglobin 16.4  13.0 - 17.0 g/dL   HCT 16.1  09.6 - 04.5 %   MCV 87.2  78.0 - 100.0 fL   MCH 30.5  26.0 - 34.0 pg   MCHC 35.0  30.0 - 36.0 g/dL   RDW 40.9  81.1 - 91.4 %   Platelets 185  150 - 400 K/uL   Neutrophils Relative % 83 (*) 43 - 77 %   Neutro Abs 13.8 (*) 1.7 - 7.7 K/uL   Lymphocytes Relative 12  12 - 46 %   Lymphs Abs 2.0  0.7 - 4.0 K/uL   Monocytes Relative 4  3 - 12 %   Monocytes Absolute 0.6  0.1 - 1.0 K/uL   Eosinophils Relative 1  0 - 5 %   Eosinophils Absolute 0.1  0.0 - 0.7 K/uL   Basophils Relative 0  0 - 1 %   Basophils Absolute 0.0  0.0 - 0.1 K/uL  COMPREHENSIVE METABOLIC PANEL      Result Value Range   Sodium 141  135 - 145 mEq/L   Potassium 4.5  3.5 - 5.1 mEq/L   Chloride 107  96 - 112 mEq/L   CO2 27  19 - 32 mEq/L   Glucose, Bld 99  70 - 99 mg/dL   BUN 15  6 - 23 mg/dL   Creatinine, Ser 7.82  0.50 - 1.35 mg/dL   Calcium 9.2  8.4 - 95.6 mg/dL   Total Protein 6.6  6.0 - 8.3 g/dL   Albumin 3.9  3.5 - 5.2 g/dL   AST 33  0 - 37 U/L   ALT 56 (*) 0 - 53 U/L   Alkaline Phosphatase 112  39 - 117 U/L   Total Bilirubin 0.3  0.3 - 1.2 mg/dL   GFR calc non Af Amer 75 (*) >90 mL/min   GFR calc Af Amer 86 (*) >90 mL/min  TROPONIN I      Result Value Range   Troponin I <0.30  <0.30 ng/mL   Dg Chest 2 View 03/09/2013    *RADIOLOGY REPORT*  Clinical Data: Chest pain  CHEST - 2 VIEW  Comparison: None.  Findings: Borderline cardiomegaly noted.  No acute infiltrate or pleural effusion.  No pulmonary edema.  Bony thorax is unremarkable.  IMPRESSION: No active disease.  Borderline cardiomegaly.   Original Report Authenticated By: Natasha Mead, M.D.   Ct Head Wo Contrast 03/09/2013   *RADIOLOGY REPORT*  Clinical Data:  Syncope  CT HEAD WITHOUT CONTRAST  Technique:  Contiguous axial images were obtained from the base of the skull through the vertex without contrast.  Comparison: 12/24/2011  Findings: No evidence of parenchymal hemorrhage or extra-axial fluid collection. No mass lesion, mass effect, or midline shift.  No CT evidence of acute infarction.  Cerebral volume is age appropriate.  No ventriculomegaly.  The visualized paranasal sinuses are essentially clear. The mastoid air cells are unopacified.  No evidence of calvarial fracture.  IMPRESSION: No evidence of acute intracranial abnormality.   Original Report Authenticated By: Charline Bills, M.D.     1640:  Pt not orthostatic. No change in neuro exam.  [facility] Cards Dr. Tresa Endo will consult, requests to admit to medicine service. T/C to [facility] Dr. Benjamine Mola, case discussed, including:  HPI, pertinent PM/SHx, VS/PE, dx testing, ED course and treatment:  Agreeable to admit, requests to write temporary orders, obtain observation tele bed to team 10.     Laray Anger, DO 03/10/13 2211

## 2013-03-09 NOTE — H&P (Signed)
[facility] History and Physical  Gregory Jackson ZOX:[id number] DOB: [birth year] DOA: 03/09/2013  Referring physician: er PCP: No primary provider on file.  Specialists: cards  Chief Complaint: passed out at a gas station  HPI: Gregory Jackson is a 37 y.o. male with a history of schizophrenia.   Who was at the gas station today and felt sick to his stomach and then passed out.  Woke up- no seizure like activity, no urination- tried to leave and when EMS arrived, had another episode.  His HR was in the 40s and BP was in the 90s. Given 2L IV in route.  In the EMS, his EKG revealed ST elevation vs. Early repol in ant lat leads. He was brought in as a code STEMI, this was cancelled by cardiology.  In the ER he now has abnormalities of T waves in the lateral views. Cards saw and will follow as consult. No CP, no SOB, +hunger, no diarrhea, no fever, no chills  Review of Systems: all systems reviewed, negative unless stated above    Past Medical History  Diagnosis Date  . Schizophrenia   . Bradycardia 03/09/2013   History reviewed. No pertinent past surgical history. Social History:  reports that he has been smoking Cigarettes.  He has been smoking about 1.00 pack per day. He has never used smokeless tobacco. He reports that he does not drink alcohol or use illicit drugs.  No Known Allergies  Family History  Problem Relation Age of Onset  . Heart disease Mother     Prior to Admission medications   Not on File   Physical Exam: Filed Vitals:   03/09/13 1550  BP: 116/58  Pulse: 58  Resp: 17     General:  flat  Eyes: wnl  ENT: wnl  Neck: supple, no tenderness  Cardiovascular: rrr  Respiratory: clear anterior  Abdomen: +Bs, soft, NT  Skin: no rashes or lesions  Musculoskeletal:   Psychiatric: flat  Neurologic: CN 2-12 intact  Labs on Admission:  Basic Metabolic Panel:  Recent Labs Lab 03/09/13 1430  NA 141  K 4.5  CL 107  CO2 27  GLUCOSE 99  BUN 15   CREATININE 1.21  CALCIUM 9.2   Liver Function Tests:  Recent Labs Lab 03/09/13 1430  AST 33  ALT 56*  ALKPHOS 112  BILITOT 0.3  PROT 6.6  ALBUMIN 3.9   No results found for this basename: LIPASE, AMYLASE,  in the last 168 hours No results found for this basename: AMMONIA,  in the last 168 hours CBC:  Recent Labs Lab 03/09/13 1430  WBC 16.6*  NEUTROABS 13.8*  HGB 16.4  HCT 46.8  MCV 87.2  PLT 185   Cardiac Enzymes:  Recent Labs Lab 03/09/13 1430  TROPONINI <0.30    BNP (last 3 results) No results found for this basename: PROBNP,  in the last 8760 hours CBG:  Recent Labs Lab 03/09/13 1423  GLUCAP 99    Radiological Exams on Admission: Dg Chest 2 View  03/09/2013   *RADIOLOGY REPORT*  Clinical Data: Chest pain  CHEST - 2 VIEW  Comparison: None.  Findings: Borderline cardiomegaly noted.  No acute infiltrate or pleural effusion.  No pulmonary edema.  Bony thorax is unremarkable.  IMPRESSION: No active disease.  Borderline cardiomegaly.   Original Report Authenticated By: Natasha Mead, M.D.   Ct Head Wo Contrast  03/09/2013   *RADIOLOGY REPORT*  Clinical Data: Syncope  CT HEAD WITHOUT CONTRAST  Technique:  Contiguous axial  images were obtained from the base of the skull through the vertex without contrast.  Comparison: 12/24/2011  Findings: No evidence of parenchymal hemorrhage or extra-axial fluid collection. No mass lesion, mass effect, or midline shift.  No CT evidence of acute infarction.  Cerebral volume is age appropriate.  No ventriculomegaly.  The visualized paranasal sinuses are essentially clear. The mastoid air cells are unopacified.  No evidence of calvarial fracture.  IMPRESSION: No evidence of acute intracranial abnormality.   Original Report Authenticated By: Charline Bills, M.D.    EKG: Independently reviewed. Sinus brady with t wave inversions  Assessment/Plan Principal Problem:   Syncope Active Problems:   Schizophrenia, paranoid type    Non-compliant patient   Bradycardia   Abnormal EKG   1. Syncope- had 2 L of IVF in-route to ER- ortho statics not accurate- check echo, monitor on tele, cards following 2. Bradycardia- monitor on tele, cards following 3. Schizophrenia- continue home meds, check EKG in AM- ? On QTC prolonging meds 4. Abnormal EKG- cards following, cycle CE  cardiology  Code Status: full Family Communication: patient Disposition Plan: obs  Time spent: 75 min  Thyra Yinger [facility] Pager [phone]  If 7PM-7AM, please contact night-coverage [email or web address] Password [facility] 03/09/2013, 4:44 PM

## 2013-03-09 NOTE — Consult Note (Signed)
Reason for Consult: syncope, abnormal EKG initially thought to be STEMI   Referring Physician: ER MD  PCP: in [facility]: No primary provider on file. Primary Cardiologist:New  Gregory Jackson is an 36 y.o. male.    Chief Complaint: felt nauseated then passed out.   HPI: 49 YOAAM with hx of Schizophrenia on trazodone and Haldol was at a gas station today, felt nauseated and passed out.   EMS was called, he passed out with them and HR was 44, BP 90.  EKG revealed ST elevation vs. Early repol in ant lat leads.  In the ER he now has abnormalities of T waves in the lateral views.   HR from the 50s to the 40s.  Gregory Jackson.  No specific chest pain.  He complains of tooth gum pain that does travel to his chest but is sharp.  STEMI was cancelled.    Pt admits to syncope 2 weeks ago at home.  No residual symptoms. No prior cardiac history.  His father is at work, but the pt lives with his father.   Past Medical History  Diagnosis Date  . Schizophrenia   . Bradycardia 03/09/2013    History reviewed. No pertinent past surgical history.  Family History  Problem Relation Age of Onset  . Heart disease Mother    Social History:  reports that he has been smoking Cigarettes.  He has been smoking about 1.00 pack per day. He has never used smokeless tobacco. He reports that he does not drink alcohol or use illicit drugs.  Allergies: No Known Allergies  Home Meds: Trazadone Haldol         Pt does not know dosage.  Results for orders placed during the hospital encounter of 03/09/13 (from the past 48 hour(s))  GLUCOSE, CAPILLARY     Status: None   Collection Time    03/09/13  2:23 PM      Result Value Range   Glucose-Capillary 99  70 - 99 mg/dL   No results found.  Labs pending  ROS: General:no colds or fevers, no weight changes Skin:no rashes or ulcers HEENT:no blurred vision, no congestion, teeth/mouth pain.  Has had half his teeth cleaned.  CV:see HPI PUL:see HPI GI:no diarrhea  constipation or melena, no indigestion, some upper abd discomfort. GU:no hematuria, no dysuria MS:no joint pain, no claudication Neuro:2 episodes of syncope, no lightheadedness Endo:no diabetes, no thyroid disease   Blood pressure 111/59, pulse 51. PE: General:Pleasant affect though very flat, NAD Skin:Warm and dry, brisk capillary refill HEENT:normocephalic, sclera clear, mucus membranes moist, leukoplakia of cheeks, lt > rt with associated soreness  Neck:supple, no JVD, no bruits  Heart:S1S2 RRR without murmur, gallup, rub or click Lungs:clear without rales, rhonchi, or wheezes EAV:WUJW, non tender, + BS, do not palpate liver spleen or masses Ext:no lower ext edema, 2+ pedal pulses, 2+ radial pulses Neuro:alert and oriented, MAE, follows commands, + facial symmetry    Assessment/Plan Principal Problem:   Syncope Active Problems:   Schizophrenia, paranoid type   Non-compliant patient   Bradycardia   Abnormal EKG  PLAN: awaiting labs and CT of the head.  ? Medications causing bradycardia?  Will order echo as well. Add magic mouthwash for mouth. Serial cardiac enzymes.  If positive would need cath.  Leone Brand  Nurse Practitioner Certified [facility] Pager [phone] 03/09/2013, 3:16 PM     Agree with note written by Nada Boozer RNP  Admitted with syncope. No prior  cardiac hx. C/O nausea as well. Sounds vagal. Exam benign. Labs OK. EKG with wjhat appears to be LVH. Doubt STEMI. Agree with admit tele. 2D echo. Will follow wit you.  [name] 03/09/2013 6:01 PM

## 2013-03-10 ENCOUNTER — Observation Stay (HOSPITAL_COMMUNITY): Payer: Self-pay

## 2013-03-10 DIAGNOSIS — R55 Syncope and collapse: Secondary | ICD-10-CM

## 2013-03-10 LAB — TROPONIN I
Troponin I: <0.3 ng/mL (ref <0.30)
Troponin I: <0.3 ng/mL (ref <0.30)

## 2013-03-10 LAB — TSH: TSH: 1.29 u[IU]/mL (ref 0.350–4.500)

## 2013-03-10 NOTE — Progress Notes (Signed)
VASCULAR LAB PRELIMINARY  PRELIMINARY  PRELIMINARY  PRELIMINARY  Carotid duplex completed.    Preliminary report:  Right - No evidence of ICA stenosis. Vertebral artery flow is antegrade. Left- No evidence of ICA stenosis. Noted is minimal intimal wall changes. Vertebral artery flow is antegrade.  Tyann Niehaus, RVS 03/10/2013, 12:37 PM

## 2013-03-10 NOTE — Progress Notes (Signed)
[facility] PROGRESS NOTE  Gregory Jackson ZOX:[id number] DOB: 07/16/1975 DOA: 03/09/2013 PCP: No primary provider on file.  Assessment/Plan: Principal Problem:   Syncope Active Problems:   Schizophrenia, paranoid type   Non-compliant patient   Bradycardia   Abnormal EKG    1. Syncope: Etiology is unclear at this time. Patient presented with recurrent syncopal episodes, (at least 3 in the past 1 week), without antecedent cheat pain or palpitations. Also no observed involuntary movements. Although SBP was borderline at presentation, he has no documented orthostasis. Asymptomatic since admission. Head CT scan is negative for acute findings. UDS was negative. 2D Echocardiogram is pending, as is carotid doppler. Will order EEG and D-Dimer. 2. Bradycardia: At presentation, patient had a HR in the 40s, and 12-lead EKG revealed SR. Per EMS, his EKG revealed ST elevation vs. early repolarization in anterolateral leads, EKG in the ER showed lateral T-Wave inversion. Dr Nanetta Batty provided cardiology consultation. No evidence of ACS so far. Cardiac enzymes remained unelevated, and patient had no chest pain or SOB. 2D Echocardiogram is  Pending.No arrhythmias have been documented on telemetry so far. 3. Schizophrenia: Stable on pre-admission Haldol and Trazodone. On QTC prolonging medication. 4. Tobacco abuse: Patient smokes a pack f cigarettes per day. Counseled appropriately.   Code Status: Full Code.  Family Communication:  Disposition Plan: T be determined.    Brief narrative: 37 y.o. male with a history of tobacco abuse, schizophrenia, who was at the gas station on 03/09/13, felt sick to his stomach and then passed out. On waking, he had no seizure like activity, no urination, tried to leave, and when EMS arrived, had another episode. His HR was in the 40s and BP was in the 90s. Given 2L IV in route. In the EMS, his EKG revealed ST elevation vs. Early repolarization in anterolateral  leads. He was brought in as a code STEMI, but this was cancelled by cardiology. In the ER he had abnormalities of T waves in the lateral views. No chest pain, shortness of breath, diarrhea, fever or chills. Admitted for further management.    Consultants:  Dr Nanetta Batty, cardiologist.   Procedures:  Head Ct Scan.  CXR.   Antibiotics:  N/A.   HPI/Subjective: Asymptomatic.   Objective: Vital signs in last 24 hours: Temp:  [97.5 F (36.4 C)-98 F (36.7 C)] 97.8 F (36.6 C) (08/27 0426) Pulse Rate:  [43-84] 52 (08/27 0426) Resp:  [14-20] 18 (08/27 0426) BP: (98-116)/(47-70) 105/63 mmHg (08/27 0426) SpO2:  [98 %-100 %] 98 % (08/27 0426) Weight:  [100.699 kg (222 lb)] 100.699 kg (222 lb) (08/26 1854) Weight change:  Last BM Date: 03/07/13  Intake/Output from previous day: 08/26 0701 - 08/27 0700 In: 847.5 [I.V.:847.5] Out: 500 [Urine:500]     Physical Exam: General: Comfortable, alert, communicative, fully oriented, not short of breath at rest.  HEENT:  No clinical pallor, no jaundice, no conjunctival injection or discharge. Hydration is fair.  NECK:  Supple, JVP not seen, no carotid bruits, no palpable lymphadenopathy, no palpable goiter. CHEST:  Clinically clear to auscultation, no wheezes, no crackles. HEART:  Sounds 1 and 2 heard, normal, regular, no murmurs. ABDOMEN:  Full, soft, non-tender, no palpable organomegaly, no palpable masses, normal bowel sounds. GENITALIA:  Not examined. LOWER EXTREMITIES:  No pitting edema, palpable peripheral pulses. MUSCULOSKELETAL SYSTEM:  Ubnremarkable. CENTRAL NERVOUS SYSTEM:  No focal neurologic deficit on gross examination.  Lab Results:  Recent Labs  03/09/13 1430  WBC 16.6*  HGB 16.4  HCT 46.8  PLT 185    Recent Labs  03/09/13 1430  NA 141  K 4.5  CL 107  CO2 27  GLUCOSE 99  BUN 15  CREATININE 1.21  CALCIUM 9.2   No results found for this or any previous visit (from the past 240 hour(s)).    Studies/Results: Dg Chest 2 View  03/09/2013   *RADIOLOGY REPORT*  Clinical Data: Chest pain  CHEST - 2 VIEW  Comparison: None.  Findings: Borderline cardiomegaly noted.  No acute infiltrate or pleural effusion.  No pulmonary edema.  Bony thorax is unremarkable.  IMPRESSION: No active disease.  Borderline cardiomegaly.   Original Report Authenticated By: Natasha Mead, M.D.   Ct Head Wo Contrast  03/09/2013   *RADIOLOGY REPORT*  Clinical Data: Syncope  CT HEAD WITHOUT CONTRAST  Technique:  Contiguous axial images were obtained from the base of the skull through the vertex without contrast.  Comparison: 12/24/2011  Findings: No evidence of parenchymal hemorrhage or extra-axial fluid collection. No mass lesion, mass effect, or midline shift.  No CT evidence of acute infarction.  Cerebral volume is age appropriate.  No ventriculomegaly.  The visualized paranasal sinuses are essentially clear. The mastoid air cells are unopacified.  No evidence of calvarial fracture.  IMPRESSION: No evidence of acute intracranial abnormality.   Original Report Authenticated By: Charline Bills, M.D.    Medications: Scheduled Meds: . sodium chloride  3 mL Intravenous Q12H   Continuous Infusions:  PRN Meds:.ondansetron (ZOFRAN) IV, ondansetron    LOS: 1 day   Nattalie Santiesteban,CHRISTOPHER  [facility] Pager [phone]. If 8PM-8AM, please contact night-coverage at [email or web address], password [facility] 03/10/2013, 7:15 AM  LOS: 1 day

## 2013-03-10 NOTE — Progress Notes (Signed)
EEG Completed; Results Pending  

## 2013-03-10 NOTE — Procedures (Signed)
ELECTROENCEPHALOGRAM REPORT   Patient: Gregory Jackson       Room #: 1O10 EEG No. ID: [id number] Age: 38 y.o.        Sex: male Referring Physician: Oti Report Date:  03/10/2013        Interpreting Physician: Thana Farr D  History: Gregory Jackson is an 37 y.o. male with syncope  Medications:  Scheduled: . sodium chloride  3 mL Intravenous Q12H    Conditions of Recording:  This is a 16 channel EEG carried out with the patient in the awake, drowsy and asleep states.  Description:  The waking background activity consists of a low voltage, symmetrical, fairly well organized, 10 Hz alpha activity, seen from the parieto-occipital and posterior temporal regions.  Low voltage fast activity, poorly organized, is seen anteriorly and is at times superimposed on more posterior regions.  A mixture of theta and alpha rhythms are seen from the central and temporal regions. The patient drowses with slowing to irregular, low voltage theta and beta activity.   The patient goes in to a light sleep with symmetrical sleep spindles, vertex central sharp transients and irregular slow activity.   Hyperventilation was not performed.  Intermittent photic stimulation was performed but failed to illicit any change in the tracing.    IMPRESSION: Normal electroencephalogram, awake, asleep and with activation procedures. There are no focal lateralizing or epileptiform features.   Thana Farr, MD [facility] [phone] 03/10/2013, 6:57 PM

## 2013-03-10 NOTE — Progress Notes (Signed)
Pt. Seen and examined. Agree with the NP/PA-C note as written.  Echocardiogram looks fairly benign. Dopplers preliminarily do not show any significant carotid stenosis. He did have bradycardia on telemetry with a short pause - not enough to indicate the need for a PPM at this time. He is not on any medications at this time and I would continue to hold trazodone and haldol. I would consider a 30 day monitor to evaluate for possible pacemaker indications after discharge.  Follow-up with Dr. Allyson Sabal or MLP in the office. Nothing further to add at this time, will sign-off.  He may be able to be discharged home.   Chrystie Nose, MD, [facility] Attending Cardiologist [facility]

## 2013-03-10 NOTE — Progress Notes (Signed)
Echo Lab  2D Echocardiogram completed.  Tekeisha Hakim L Gredmarie Delange, RDCS 03/10/2013 12:26 PM

## 2013-03-10 NOTE — Progress Notes (Signed)
    Subjective: Complains of some sharp pains in his legs "every now and then"  Objective: Vital signs in last 24 hours: Temp:  [97.5 F (36.4 C)-98 F (36.7 C)] 97.8 F (36.6 C) (08/27 0426) Pulse Rate:  [43-84] 52 (08/27 0426) Resp:  [14-20] 18 (08/27 0426) BP: (98-116)/(47-70) 105/63 mmHg (08/27 0426) SpO2:  [98 %-100 %] 98 % (08/27 0426) Weight:  [222 lb (100.699 kg)] 222 lb (100.699 kg) (08/26 1854) Last BM Date: 03/07/13  Intake/Output from previous day: 08/26 0701 - 08/27 0700 In: 847.5 [I.V.:847.5] Out: 500 [Urine:500] Intake/Output this shift: Total I/O In: 360 [P.O.:360] Out: -   Medications Current Facility-Administered Medications  Medication Dose Route Frequency Provider Last Rate Last Dose  . ondansetron (ZOFRAN) tablet 4 mg  4 mg Oral Q6H PRN Joseph Art, DO       Or  . ondansetron (ZOFRAN) injection 4 mg  4 mg Intravenous Q6H PRN Joseph Art, DO      . sodium chloride 0.9 % injection 3 mL  3 mL Intravenous Q12H Joseph Art, DO        PE: General appearance: alert, cooperative and no distress Lungs: clear to auscultation bilaterally Heart: irregularly irregular rhythm and 1/6 harsh sounding sys MM LSB and Apex Extremities: No LEE Pulses: 2+ and symmetric Skin: Warm and dry Neurologic: Grossly normal  Lab Results:   Recent Labs  03/09/13 1430  WBC 16.6*  HGB 16.4  HCT 46.8  PLT 185   BMET  Recent Labs  03/09/13 1430  NA 141  K 4.5  CL 107  CO2 27  GLUCOSE 99  BUN 15  CREATININE 1.21  CALCIUM 9.2    Assessment/Plan  Principal Problem:   Syncope Active Problems:   Schizophrenia, paranoid type   Non-compliant patient   Bradycardia   Abnormal EKG  PLAN:  Review of telemetry shows consistent bradycardia into the low 30's and sinus node dysfunction.  HR is irregular.  He did have a 2.0 sec pause/ HR at 30BPM for a few beats.  Perhaps severe bradycardia was the cause of his syncope.  No MI.  UDS negative.  TSH WNL.   SYS  MM on exam.  Echo pending.  ? Need for PPM.     LOS: 1 day    Demia Viera 03/10/2013 8:38 AM

## 2013-03-10 NOTE — Progress Notes (Signed)
Utilization review completed.  

## 2013-03-11 DIAGNOSIS — R55 Syncope and collapse: Secondary | ICD-10-CM

## 2013-03-11 LAB — CBC
MCH: 30.1 pg (ref 26.0–34.0)
MCHC: 34.5 g/dL (ref 30.0–36.0)
MCV: 87.1 fL (ref 78.0–100.0)
Platelets: 182 10*3/uL (ref 150–400)
RBC: 5.35 MIL/uL (ref 4.22–5.81)
RDW: 13.1 % (ref 11.5–15.5)

## 2013-03-11 LAB — BASIC METABOLIC PANEL
Calcium: 9.3 mg/dL (ref 8.4–10.5)
Creatinine, Ser: 1.01 mg/dL (ref 0.50–1.35)
GFR calc Af Amer: >90 mL/min (ref >90)
GFR calc non Af Amer: >90 mL/min (ref >90)
Sodium: 139 mEq/L (ref 135–145)

## 2013-03-11 NOTE — Progress Notes (Signed)
Pts HR has been dropping with sleep and at rest. Hr maintaining in the 30's with sleep even as low as 32. Pt has also had some junctional beats. Pt denies any symptoms. Gregory Poag, NP made aware of above. She ordered an EKG, which was done and transmitted in EPIC. Will cont to monitor.

## 2013-03-11 NOTE — Progress Notes (Signed)
[facility] PROGRESS NOTE  Gregory Jackson:[id number] DOB: 1975-10-24 DOA: 03/09/2013 PCP: No primary provider on file.  Assessment/Plan: Principal Problem:   Syncope Active Problems:   Schizophrenia, paranoid type   Non-compliant patient   Bradycardia   Abnormal EKG    1. Syncope: Etiology is unclear at this time. Patient presented with recurrent syncopal episodes, (at least 3 in the past 1 week), without antecedent chest pain or palpitations. Also no observed involuntary movements. Although SBP was borderline at presentation, he has no documented orthostasis. Head CT scan was negative for acute findings. UDS was negative. D-dimer was <0.27, and EEG was a normal study. Vascular doppler showed no evidence of ICA stenosis and vertebral artery flow is antegrade. Patient has remained asymptomatic since admission.  2. Bradycardia: At presentation, patient had a HR in the 40s, and 12-lead EKG revealed SR. Per EMS, his EKG revealed ST elevation vs. early repolarization in anterolateral leads, EKG in the ER showed lateral T-Wave inversion. Dr Nanetta Batty provided cardiology consultation. No evidence of ACS so far. Cardiac enzymes remained unelevated, and patient had no chest pain or SOB. 2D Echocardiogram revealed normal LV cavity size, EF of 55% to 60%. Left ventricular diastolic function parameters were normal, LA was normal in size. Trivial regurgitation. PA peak pressure: 34mm Hg (S). No arrhythmias have been documented on telemetry so far. As HR dropped to the 30's overnight, EP consult has been requested Awaiting recommendations.  3. Schizophrenia: Stable pre-admission Haldol and Trazodone have been placed on hold for now (QTC prolonging medication). 4. Tobacco abuse: Patient smokes a pack of cigarettes per day. Counseled appropriately.   Code Status: Full Code.  Family Communication:  Disposition Plan: T be determined.    Brief narrative: 37 y.o. male with a history of tobacco  abuse, schizophrenia, who was at the gas station on 03/09/13, felt sick to his stomach and then passed out. On waking, he had no seizure like activity, no urination, tried to leave, and when EMS arrived, had another episode. His HR was in the 40s and BP was in the 90s. Given 2L IV in route. In the EMS, his EKG revealed ST elevation vs. Early repolarization in anterolateral leads. He was brought in as a code STEMI, but this was cancelled by cardiology. In the ER he had abnormalities of T waves in the lateral views. No chest pain, shortness of breath, diarrhea, fever or chills. Admitted for further management.    Consultants:  Dr Nanetta Batty, cardiologist.   Procedures:  Head CT Scan.  CXR.   2D Echo.  Carotid Doppler.   Antibiotics:  N/A.   HPI/Subjective: Asymptomatic.   Objective: Vital signs in last 24 hours: Temp:  [97.8 F (36.6 C)-98.3 F (36.8 C)] 98.1 F (36.7 C) (08/28 0444) Pulse Rate:  [42-72] 42 (08/28 0444) Resp:  [18] 18 (08/28 0444) BP: (98-122)/(56-60) 98/60 mmHg (08/28 0444) SpO2:  [98 %-99 %] 99 % (08/28 0444) Weight change:  Last BM Date: 03/07/13  Intake/Output from previous day: 08/27 0701 - 08/28 0700 In: 720 [P.O.:720] Out: 350 [Urine:350] Total I/O In: 300 [P.O.:300] Out: 500 [Urine:500]   Physical Exam: General: Comfortable, alert, communicative, fully oriented, not short of breath at rest. Ambulation.  HEENT:  No clinical pallor, no jaundice, no conjunctival injection or discharge. Hydration is fair.  NECK:  Supple, JVP not seen, no carotid bruits, no palpable lymphadenopathy, no palpable goiter. CHEST:  Clinically clear to auscultation, no wheezes, no crackles. HEART:  Sounds 1 and  2 heard, normal, regular, no murmurs. ABDOMEN:  Full, soft, non-tender, no palpable organomegaly, no palpable masses, normal bowel sounds. GENITALIA:  Not examined. LOWER EXTREMITIES:  No pitting edema, palpable peripheral pulses. MUSCULOSKELETAL SYSTEM:   Ubnremarkable. CENTRAL NERVOUS SYSTEM:  No focal neurologic deficit on gross examination.  Lab Results:  Recent Labs  03/09/13 1430 03/11/13 0401  WBC 16.6* 7.8  HGB 16.4 16.1  HCT 46.8 46.6  PLT 185 182    Recent Labs  03/09/13 1430 03/11/13 0401  NA 141 139  K 4.5 4.2  CL 107 106  CO2 27 25  GLUCOSE 99 91  BUN 15 13  CREATININE 1.21 1.01  CALCIUM 9.2 9.3   No results found for this or any previous visit (from the past 240 hour(s)).   Studies/Results: Dg Chest 2 View  03/09/2013   *RADIOLOGY REPORT*  Clinical Data: Chest pain  CHEST - 2 VIEW  Comparison: None.  Findings: Borderline cardiomegaly noted.  No acute infiltrate or pleural effusion.  No pulmonary edema.  Bony thorax is unremarkable.  IMPRESSION: No active disease.  Borderline cardiomegaly.   Original Report Authenticated By: Natasha Mead, M.D.   Ct Head Wo Contrast  03/09/2013   *RADIOLOGY REPORT*  Clinical Data: Syncope  CT HEAD WITHOUT CONTRAST  Technique:  Contiguous axial images were obtained from the base of the skull through the vertex without contrast.  Comparison: 12/24/2011  Findings: No evidence of parenchymal hemorrhage or extra-axial fluid collection. No mass lesion, mass effect, or midline shift.  No CT evidence of acute infarction.  Cerebral volume is age appropriate.  No ventriculomegaly.  The visualized paranasal sinuses are essentially clear. The mastoid air cells are unopacified.  No evidence of calvarial fracture.  IMPRESSION: No evidence of acute intracranial abnormality.   Original Report Authenticated By: Charline Bills, M.D.    Medications: Scheduled Meds: . sodium chloride  3 mL Intravenous Q12H   Continuous Infusions:  PRN Meds:.ondansetron (ZOFRAN) IV, ondansetron    LOS: 2 days   Mykel Sponaugle,CHRISTOPHER  [facility] Pager [phone]. If 8PM-8AM, please contact night-coverage at [email or web address], password [facility] 03/11/2013, 1:24 PM  LOS: 2 days

## 2013-03-11 NOTE — Consult Note (Signed)
ELECTROPHYSIOLOGY CONSULT NOTE  Patient ID: Gregory Jackson MRN: [id number], DOB/AGE: 1975/11/20   Admit date: 03/09/2013 Date of Consult: 03/11/2013  Primary Physician: in [place], [place]  Primary Cardiologist: Nanetta Batty, MD Reason for Consultation: Syncope, bradycardia  History of Present Illness Gregory Jackson is a 37 y.o. male with schizophrenia who was admitted 2 days ago for evaluation of syncope. [name] denies any cardiac history, specifically CAD/MI, valvular heart disease, HF or dysrhythmias. He has had 2 episodes of syncope in the last month. He reports his first episode occurred 3 weeks ago while he was sitting atop the doghouse in his yard playing with his dogs. He started to feel nauseated and weak then stood up and had brief LOC. He was alone so the duration is unknown but he feels it was only for a few seconds. He had another episode of syncope on the day of admission at which time he was standing inside a gas station waiting to pay for gas when he felt "sick" with nausea. He then walked outside and his nausea worsened just prior to his LOC. With both episodes he denies CP, SOB, palpitations or dizziness. He denies vomiting or diaphoresis. He states he otherwise felt "fine" without any other symptoms. He denies any recent illness, fever or chills. He denies any recent changes to his medications.   On admission and throughout his hospitalization he has been bradycardic with rates in the upper 30s-40s at rest and nocturnally. This has been sinus bradycardia without evidence of AV block or pause. His rate improves with activity into the 50s. He denies any symptoms that correlate to his bradycardia while here. He denies SOB, dizziness, fatigue or near syncope. His echo shows normal heart structure and function. His CEs are negative.   Past Medical History Past Medical History  Diagnosis Date  . Schizophrenia   . Bradycardia 03/09/2013    Past Surgical History History  reviewed. No pertinent past surgical history.   Allergies/Intolerances Allergies  Allergen Reactions  . Chocolate Rash  . Milk-Related Compounds Rash   Current Home Medications      haloperidol 5 MG tablet  Commonly known as:  HALDOL  Take 5 mg by mouth 2 (two) times daily.     OVER THE COUNTER MEDICATION  Take 2 tablets by mouth 2 (two) times daily as needed (congestion). Sinus medication from the dollar store     traZODone 100 MG tablet  Commonly known as:  DESYREL  Take 100 mg by mouth at bedtime as needed for sleep.     Current Inpatient Medications . sodium chloride  3 mL Intravenous Q12H   Family History Family History  Problem Relation Age of Onset  . Heart disease Mother     Social History Social History  . Marital Status: Single   Social History Main Topics  . Smoking status: Current Every Day Smoker -- 1.00 packs/day    Types: Cigarettes  . Smokeless tobacco: Never Used  . Alcohol Use: No  . Drug Use: No   Review of Systems General: No chills, fever, night sweats or weight changes  Cardiovascular:  No chest pain, dyspnea on exertion, edema, orthopnea, palpitations, paroxysmal nocturnal dyspnea Dermatological: No rash, lesions or masses Respiratory: No cough, dyspnea Urologic: No hematuria, dysuria Abdominal: No nausea, vomiting, diarrhea, bright red blood per rectum, melena, or hematemesis Neurologic: No visual changes All other systems reviewed and are otherwise negative except as noted above.  Physical Exam Vitals: Blood pressure 98/60, pulse  42, temperature 98.1 F (36.7 C), temperature source Oral, resp. rate 18, height 6\' 5"  (1.956 m), weight 222 lb (100.699 kg), SpO2 99.00%.  General: Well developed, well appearing 37 y.o. male in no acute distress. HEENT: Normocephalic, atraumatic. EOMs intact. Sclera nonicteric. Oropharynx clear.  Neck: Supple. No JVD. Lungs: Respirations regular and unlabored, CTA bilaterally. No wheezes, rales or  rhonchi. Heart: RRR. S1, S2 present. No murmurs, rub, S3 or S4. Abdomen: Soft, non-distended.  Extremities: No clubbing, cyanosis or edema. DP/PT/Radials 2+ and equal bilaterally. Psych: Flat affect. Neuro: Alert and oriented X 3. Moves all extremities spontaneously. Musculoskeletal: No kyphosis. Skin: Intact. Warm and dry. No rashes or petechiae in exposed areas.   Labs  Recent Labs  03/09/13 1430 03/09/13 1825 03/10/13 0016 03/10/13 0515  TROPONINI <0.30 <0.30 <0.30 <0.30   Lab Results  Component Value Date   WBC 7.8 03/11/2013   HGB 16.1 03/11/2013   HCT 46.6 03/11/2013   MCV 87.1 03/11/2013   PLT 182 03/11/2013     Recent Labs Lab 03/09/13 1430 03/11/13 0401  NA 141 139  K 4.5 4.2  CL 107 106  CO2 27 25  BUN 15 13  CREATININE 1.21 1.01  CALCIUM 9.2 9.3  PROT 6.6  --   BILITOT 0.3  --   ALKPHOS 112  --   ALT 56*  --   AST 33  --   GLUCOSE 99 91   Lab Results  Component Value Date   DDIMER <0.27 03/10/2013    Recent Labs  03/09/13 1900  TSH 1.290    Radiology/Studies Dg Chest 2 View 03/09/2013   *RADIOLOGY REPORT*  Clinical Data: Chest pain  CHEST - 2 VIEW  Comparison: None.  Findings: Borderline cardiomegaly noted.  No acute infiltrate or pleural effusion.  No pulmonary edema.  Bony thorax is unremarkable.  IMPRESSION: No active disease.  Borderline cardiomegaly.   Original Report Authenticated By: Natasha Mead, M.D.   Ct Head Wo Contrast 03/09/2013   *RADIOLOGY REPORT*  Clinical Data: Syncope  CT HEAD WITHOUT CONTRAST  Technique:  Contiguous axial images were obtained from the base of the skull through the vertex without contrast.  Comparison: 12/24/2011  Findings: No evidence of parenchymal hemorrhage or extra-axial fluid collection. No mass lesion, mass effect, or midline shift.  No CT evidence of acute infarction.  Cerebral volume is age appropriate.  No ventriculomegaly.  The visualized paranasal sinuses are essentially clear. The mastoid air cells are  unopacified.  No evidence of calvarial fracture.  IMPRESSION: No evidence of acute intracranial abnormality.   Original Report Authenticated By: Charline Bills, M.D.   Echocardiogram  Study Conclusions - Left ventricle: The cavity size was normal. Wall thickness was normal. Systolic function was normal. The estimated ejection fraction was in the range of 55% to 60%. Left ventricular diastolic function parameters were normal. - Left atrium: The atrium was normal in size. - Tricuspid valve: Poorly visualized. Trivial regurgitation. - Pulmonary arteries: PA peak pressure: 34mm Hg (S). - Inferior vena cava: The vessel was normal in size; the respirophasic diameter changes were in the normal range  (=50%); findings are consistent with normal central venous Pressure.  12-lead ECG on admission shows sinus bradycardia at 50 bpm with biphasic T waves laterally Telemetry shows SR in mid-50s while awake, intermittent sinus bradycardia nocturnally; no evidence of significant bradycardia, AV block or pause  Assessment and Plan 1. Syncope 2. Sinus bradycardia 3. Schizophrenia  Mr. Lundin presents with recurrent syncope of unknown etiology.  CEs negative and his echo shows normal heart structure and function. He has not had symptoms while here and telemetry reveals nocturnal sinus bradycardia without AV block or pause. At this time we recommend a 30-day event monitor for further evaluation.     Dr. Ladona Ridgel to see. Please see recommendations below. Signed, Rick Duff, PA-C 03/11/2013, 2:12 PM  EP Attending  Patient seen and examined. Agree with above. He has bradycardia which I suspect is not related to his syncope. The most likely etiology is autonomic dysfunction. I would suggest a cardiac monitor. I would not be inclined to recommend ILR in a patient with schizsophrenia.   Leonia Reeves.D.

## 2013-03-12 NOTE — Progress Notes (Signed)
Pt and father given discharge instructions without any questions.

## 2013-03-12 NOTE — Discharge Summary (Signed)
Physician Discharge Summary  Gregory Jackson GNF:[id number] DOB: September 14, 1975 DOA: 03/09/2013  PCP: No primary provider on file.  Admit date: 03/09/2013 Discharge date: 03/12/2013  Time spent: 40 minutes  Recommendations for Outpatient Follow-up:  1. Follow up with Dr Nanetta Batty, cardiologist. 2. 30-Day event monitor, to be arranged by cardiologist.   Discharge Diagnoses:  Principal Problem:   Syncope Active Problems:   Schizophrenia, paranoid type   Non-compliant patient   Bradycardia   Abnormal EKG   Discharge Condition: Satisfactory.   Diet recommendation: Regular.   Filed Weights   03/09/13 1854  Weight: 100.699 kg (222 lb)    History of present illness:  [facility] Course:  1. Syncope: Etiology is unclear at this time. Patient presented with recurrent syncopal episodes, (at least 3 in the past 1 week), without antecedent chest pain or palpitations. Also no observed involuntary movements. Although SBP was borderline at presentation, he had no documented orthostasis. Head CT scan was negative for acute findings. UDS was negative. D-dimer was <0.27, and EEG was a normal study. Vascular doppler showed no evidence of ICA stenosis and vertebral artery flow is antegrade. Patient has remained asymptomatic throughout his hospitalization.  2. Bradycardia: At presentation, patient had a HR in the 40s, and 12-lead EKG revealed SR. Per EMS, his EKG revealed ST elevation vs. early repolarization in anterolateral leads, EKG in the ER showed lateral T-Wave inversion. Dr Nanetta Batty provided cardiology consultation. No evidence of ACS so far. Cardiac enzymes remained unelevated, and patient had no chest pain or SOB. 2D Echocardiogram revealed normal LV cavity size, EF of 55% to 60%. Left ventricular diastolic function parameters were normal, LA was normal in size. Trivial regurgitation. PA peak pressure: 34mm Hg (S). No arrhythmias have been documented on telemetry so far. As HR dropped to  the 30's overnight, EP consultation was provided by Dr Lewayne Bunting.  3. Schizophrenia: Stable pre-admission Haldol and Trazodone were temporarily placed on hold. Telemetric monitoring showed no ventricular arrhythmias. Psychotropic medications were reinstated on  Discharge.  4. Tobacco abuse: Patient smokes a pack of cigarettes per day. Counseled appropriately.    Procedures:  See below.   Consultations:  Dr Nanetta Batty, cardiologist.  Dr Lewayne Bunting, EP.   Discharge Exam: Filed Vitals:   03/12/13 0458  BP: 105/66  Pulse: 53  Temp: 98 F (36.7 C)  Resp: 18    General: Comfortable, alert, communicative, fully oriented, not short of breath at rest. Ambulation.  HEENT: No clinical pallor, no jaundice, no conjunctival injection or discharge. Hydration is fair.  NECK: Supple, JVP not seen, no carotid bruits, no palpable lymphadenopathy, no palpable goiter.  CHEST: Clinically clear to auscultation, no wheezes, no crackles.  HEART: Sounds 1 and 2 heard, normal, regular, no murmurs.  ABDOMEN: Full, soft, non-tender, no palpable organomegaly, no palpable masses, normal bowel sounds.  GENITALIA: Not examined.  LOWER EXTREMITIES: No pitting edema, palpable peripheral pulses.  MUSCULOSKELETAL SYSTEM: Ubnremarkable.  CENTRAL NERVOUS SYSTEM: No focal neurologic deficit on gross examination.  Discharge Instructions      Discharge Orders   Future Orders Complete By Expires   Diet general  As directed    Increase activity slowly  As directed        Medication List    STOP taking these medications       OVER THE COUNTER MEDICATION      TAKE these medications       haloperidol 5 MG tablet  Commonly known as:  HALDOL  Take 5 mg by mouth 2 (two) times daily.     traZODone 100 MG tablet  Commonly known as:  DESYREL  Take 100 mg by mouth at bedtime as needed for sleep.       Allergies  Allergen Reactions  . Chocolate Rash  . Milk-Related Compounds Rash   Follow-up  Information   Follow up with [name], MD. Call in 1 day. (Please call on Tuesday 03/16/13. )    Specialty:  Cardiology   Contact information:   [address] [place] [address] [phone]        The results of significant diagnostics from this hospitalization (including imaging, microbiology, ancillary and laboratory) are listed below for reference.    Significant Diagnostic Studies: Dg Chest 2 View  03/09/2013   *RADIOLOGY REPORT*  Clinical Data: Chest pain  CHEST - 2 VIEW  Comparison: None.  Findings: Borderline cardiomegaly noted.  No acute infiltrate or pleural effusion.  No pulmonary edema.  Bony thorax is unremarkable.  IMPRESSION: No active disease.  Borderline cardiomegaly.   Original Report Authenticated By: Natasha Mead, M.D.   Ct Head Wo Contrast  03/09/2013   *RADIOLOGY REPORT*  Clinical Data: Syncope  CT HEAD WITHOUT CONTRAST  Technique:  Contiguous axial images were obtained from the base of the skull through the vertex without contrast.  Comparison: 12/24/2011  Findings: No evidence of parenchymal hemorrhage or extra-axial fluid collection. No mass lesion, mass effect, or midline shift.  No CT evidence of acute infarction.  Cerebral volume is age appropriate.  No ventriculomegaly.  The visualized paranasal sinuses are essentially clear. The mastoid air cells are unopacified.  No evidence of calvarial fracture.  IMPRESSION: No evidence of acute intracranial abnormality.   Original Report Authenticated By: Charline Bills, M.D.    Microbiology: No results found for this or any previous visit (from the past 240 hour(s)).   Labs: Basic Metabolic Panel:  Recent Labs Lab 03/09/13 1430 03/11/13 0401  NA 141 139  K 4.5 4.2  CL 107 106  CO2 27 25  GLUCOSE 99 91  BUN 15 13  CREATININE 1.21 1.01  CALCIUM 9.2 9.3   Liver Function Tests:  Recent Labs Lab 03/09/13 1430  AST 33  ALT 56*  ALKPHOS 112  BILITOT 0.3  PROT 6.6  ALBUMIN 3.9   No  results found for this basename: LIPASE, AMYLASE,  in the last 168 hours No results found for this basename: AMMONIA,  in the last 168 hours CBC:  Recent Labs Lab 03/09/13 1430 03/11/13 0401  WBC 16.6* 7.8  NEUTROABS 13.8*  --   HGB 16.4 16.1  HCT 46.8 46.6  MCV 87.2 87.1  PLT 185 182   Cardiac Enzymes:  Recent Labs Lab 03/09/13 1430 03/09/13 1825 03/10/13 0016 03/10/13 0515  TROPONINI <0.30 <0.30 <0.30 <0.30   BNP: BNP (last 3 results) No results found for this basename: PROBNP,  in the last 8760 hours CBG:  Recent Labs Lab 03/09/13 1423  GLUCAP 99       Signed:  Takeshia Wenk,CHRISTOPHER  [facility] 03/12/2013, 10:54 AM

## 2013-03-24 ENCOUNTER — Ambulatory Visit: Payer: Self-pay | Admitting: Cardiovascular Disease

## 2013-06-01 DIAGNOSIS — F209 Schizophrenia, unspecified: Secondary | ICD-10-CM | POA: Diagnosis not present

## 2013-06-01 DIAGNOSIS — Z79899 Other long term (current) drug therapy: Secondary | ICD-10-CM | POA: Diagnosis not present

## 2013-10-06 DIAGNOSIS — F209 Schizophrenia, unspecified: Secondary | ICD-10-CM | POA: Diagnosis not present

## 2014-02-21 IMAGING — CR DG CHEST 2V
1 series · 1 of 1 positions shown · non-contrast
Comparison: None.

CLINICAL DATA: Chest pain

CHEST - 2 VIEW

[w chest lat]
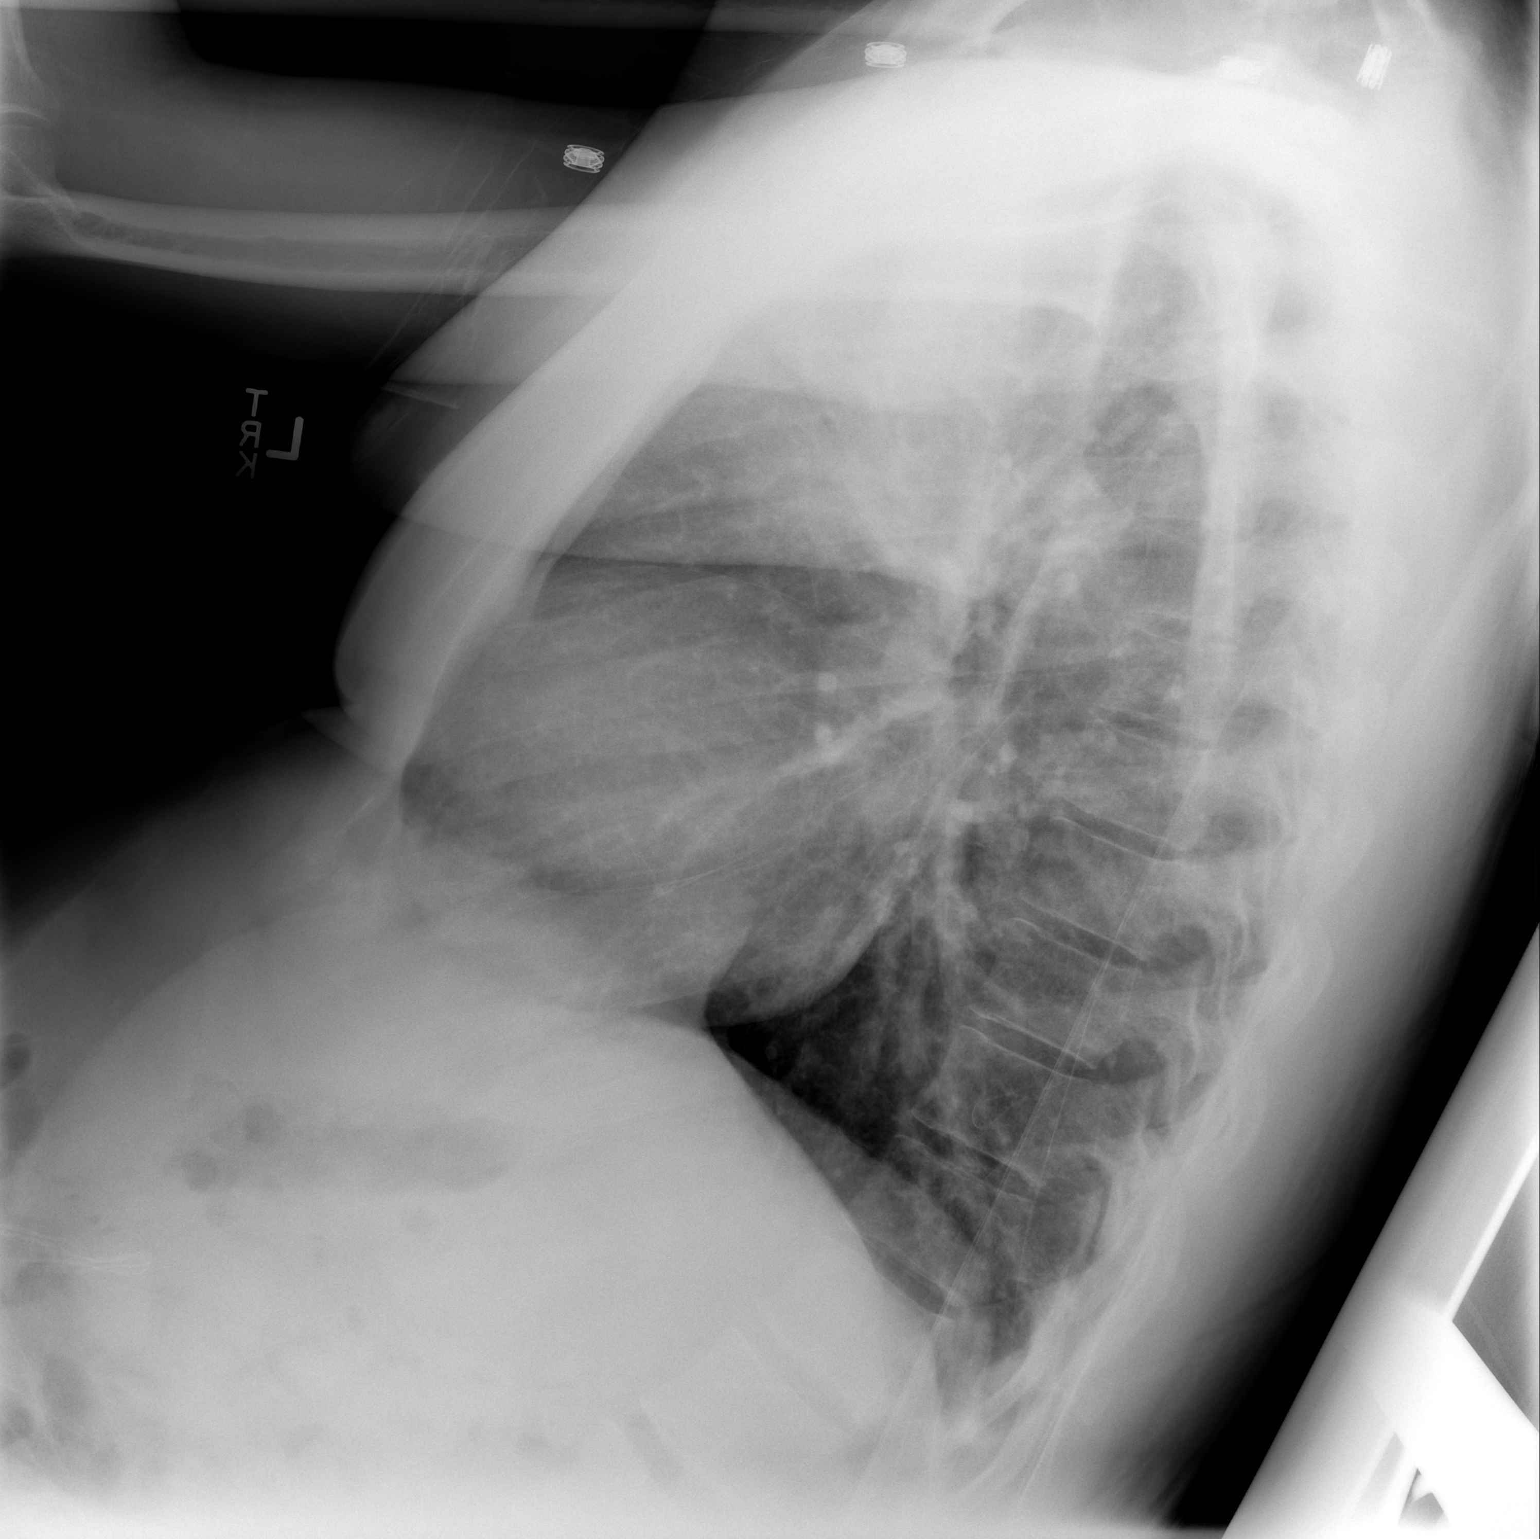

[1 of 1 positions shown; findings below may reference images not displayed]

FINDINGS: Borderline cardiomegaly noted.  No acute infiltrate or
pleural effusion.  No pulmonary edema.  Bony thorax is
unremarkable.
IMPRESSION: No active disease.  Borderline cardiomegaly.

## 2014-02-21 IMAGING — CT CT HEAD W/O CM
2 series · 16 of 30 positions shown, 20 images · non-contrast
Comparison: 12/24/2011

CLINICAL DATA: Syncope

CT HEAD WITHOUT CONTRAST
TECHNIQUE: Contiguous axial images were obtained from the base of
the skull through the vertex without contrast.

[Series 2: head w/o · axial · non-contrast · 0.49mm/px · z∈[+102,+242]mm · 13 of 34 slices shown, 17 images]
[im 3/34  brain]
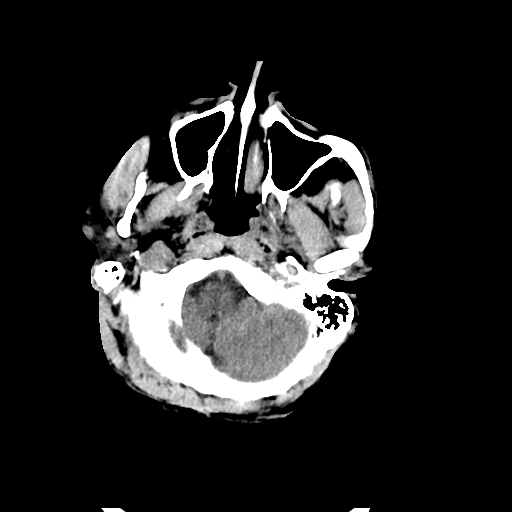
[im 3/34  bone]
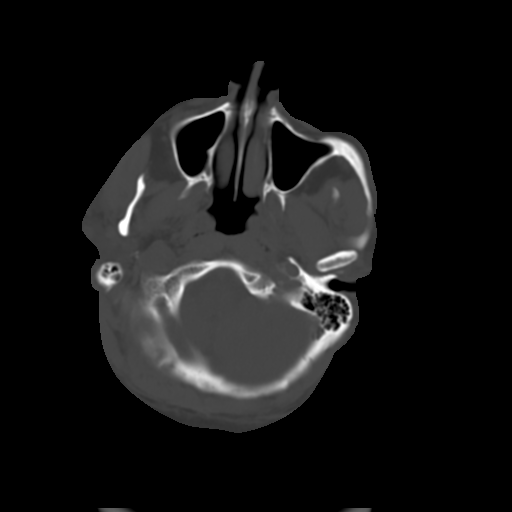
[im 5/34  brain]
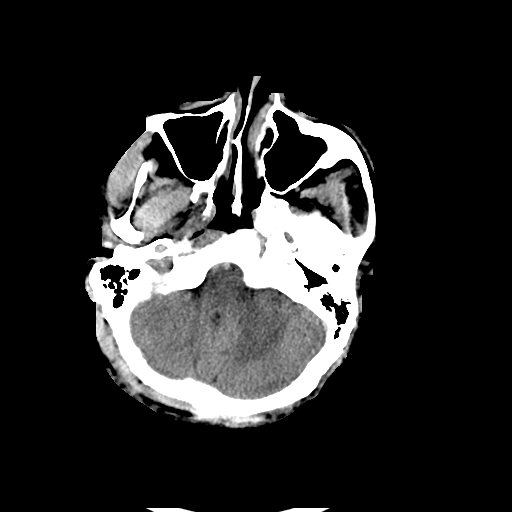
[im 8/34  brain]
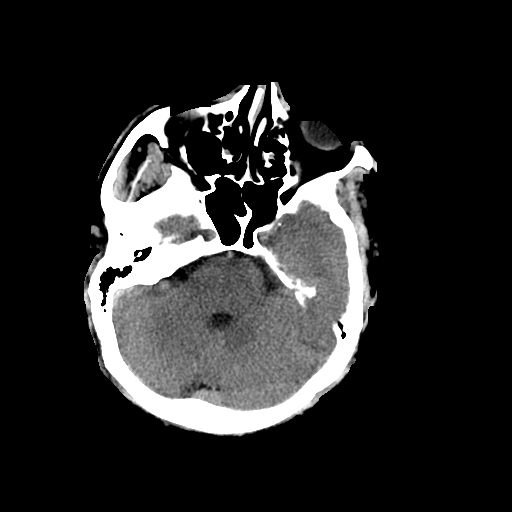
[im 10/34  brain]
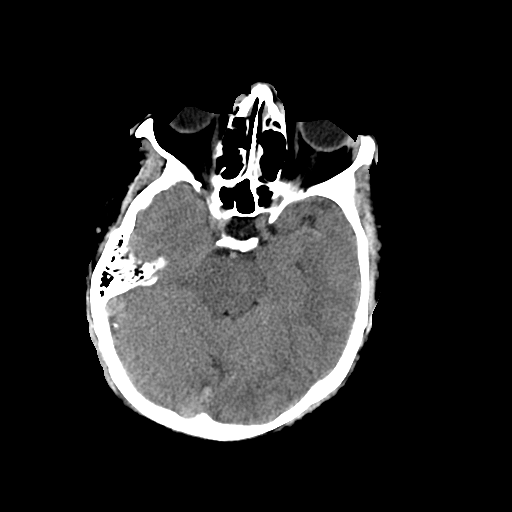
[im 12/34  brain]
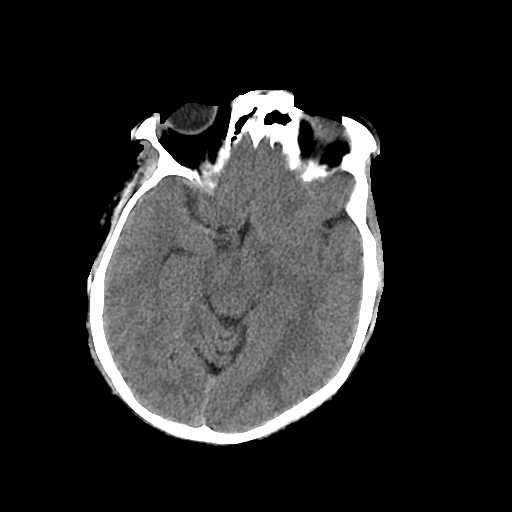
[im 12/34  bone]
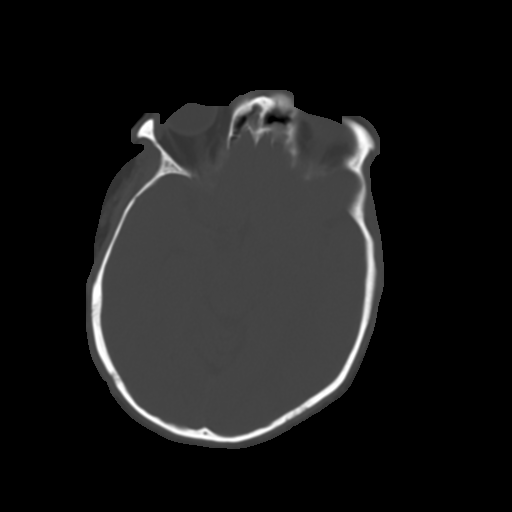
[im 15/34  brain]
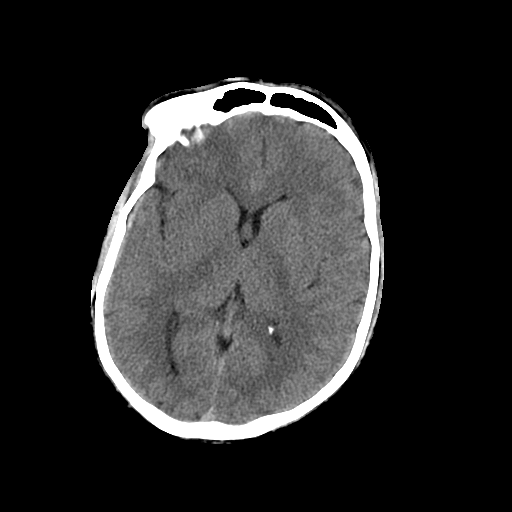
[im 17/34  brain]
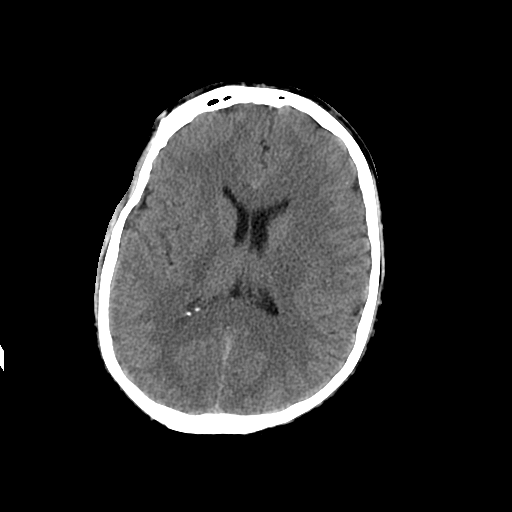
[im 19/34  brain]
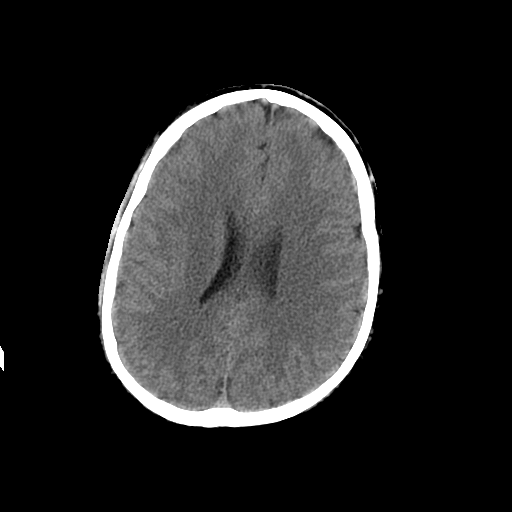
[im 22/34  brain]
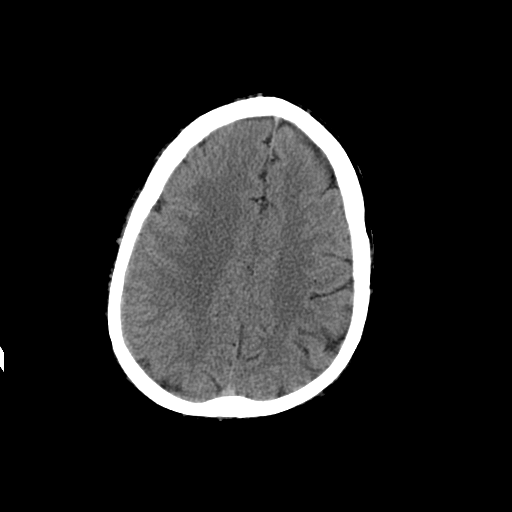
[im 22/34  bone]
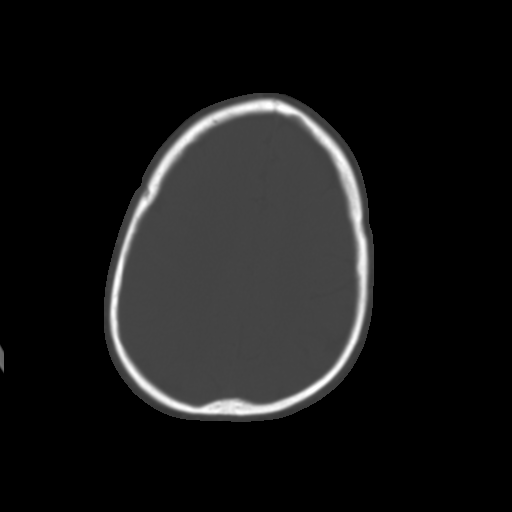
[im 24/34  brain]
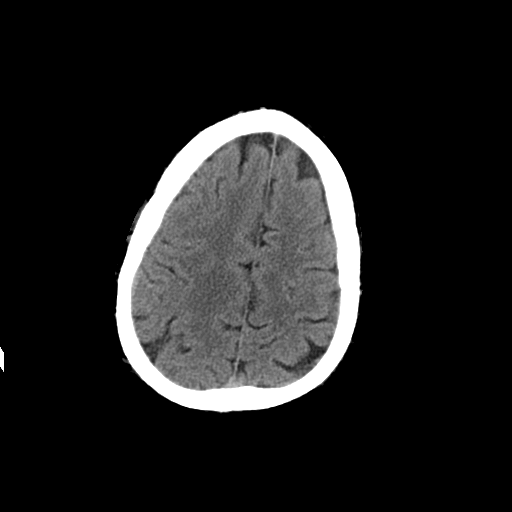
[im 26/34  brain]
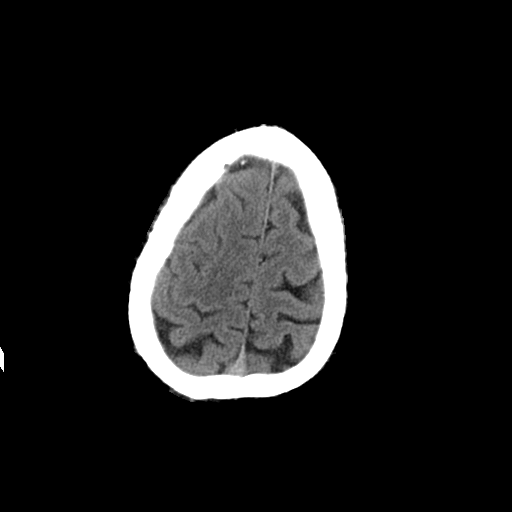
[im 29/34  brain]
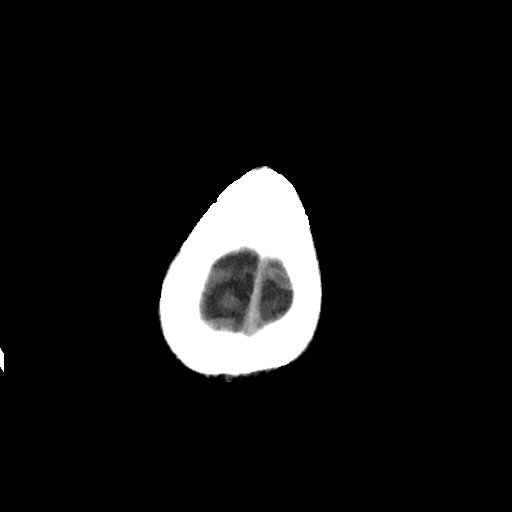
[im 31/34  brain]
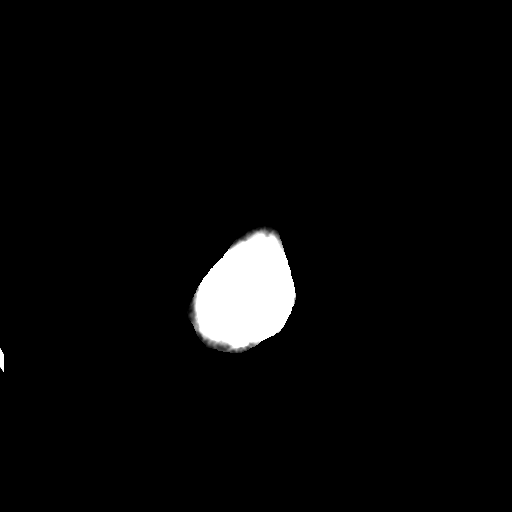
[im 31/34  bone]
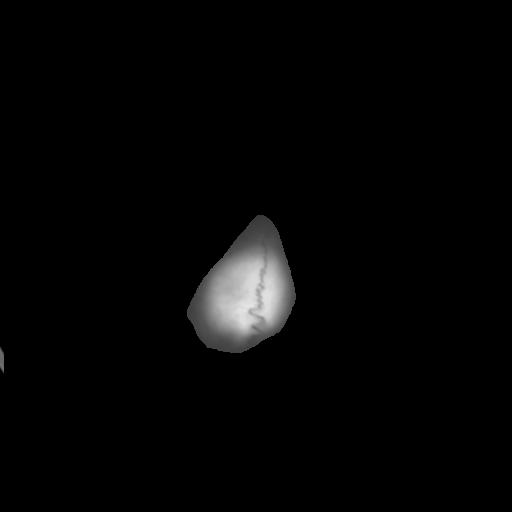

[Series 3: head w/o bone · axial · non-contrast · 0.49mm/px · z∈[+102,+147]mm · 3 of 34 slices shown]
[im 3/34  bone]
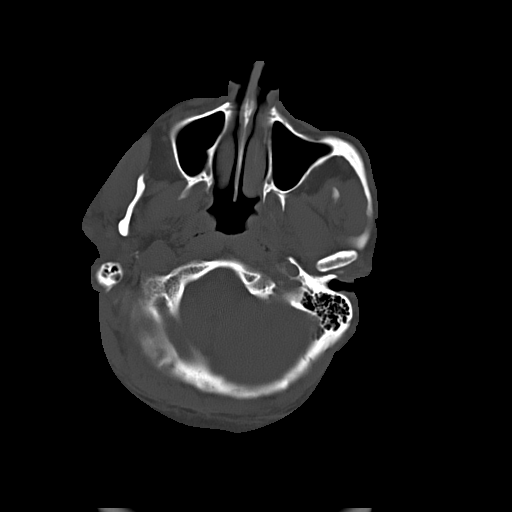
[im 8/34  bone]
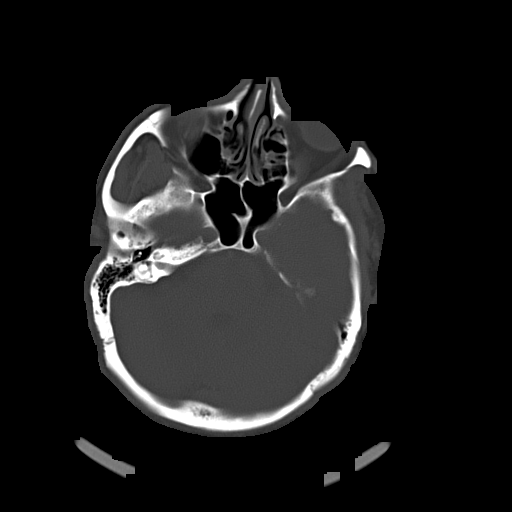
[im 12/34  bone]
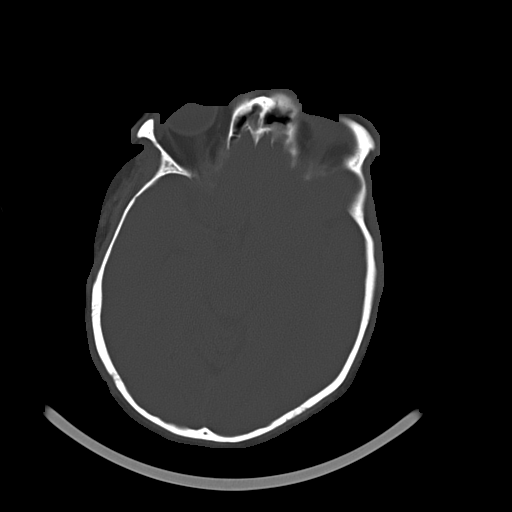

[16 of 30 positions shown; findings below may reference images not displayed]

FINDINGS: No evidence of parenchymal hemorrhage or extra-axial
fluid collection. No mass lesion, mass effect, or midline shift.

No CT evidence of acute infarction.

Cerebral volume is age appropriate.  No ventriculomegaly.

The visualized paranasal sinuses are essentially clear. The mastoid
air cells are unopacified.

No evidence of calvarial fracture.
IMPRESSION: No evidence of acute intracranial abnormality.

## 2014-08-12 DIAGNOSIS — R4781 Slurred speech: Secondary | ICD-10-CM | POA: Diagnosis not present

## 2014-08-12 DIAGNOSIS — F209 Schizophrenia, unspecified: Secondary | ICD-10-CM | POA: Diagnosis not present

## 2014-08-12 DIAGNOSIS — T7849XA Other allergy, initial encounter: Secondary | ICD-10-CM | POA: Diagnosis not present

## 2014-08-12 DIAGNOSIS — I6789 Other cerebrovascular disease: Secondary | ICD-10-CM | POA: Diagnosis not present

## 2014-08-12 DIAGNOSIS — T7840XA Allergy, unspecified, initial encounter: Secondary | ICD-10-CM | POA: Diagnosis not present

## 2014-08-12 DIAGNOSIS — Z87891 Personal history of nicotine dependence: Secondary | ICD-10-CM | POA: Diagnosis not present

## 2014-10-25 ENCOUNTER — Telehealth: Payer: Self-pay | Admitting: Family Medicine

## 2014-10-25 NOTE — Telephone Encounter (Signed)
Pt given new pt appt with Dr.Miller 5/31 at 10:15. Pt has medicare and he is currently on Haldol, pt sees psychiatrist in [place] every 6 weeks and will be brought in by his case worker and will always have someone come in with him. Spoke to caseworker and she is aware to bring pt in 20-30 minutes prior to appt time.

## 2014-12-13 ENCOUNTER — Encounter: Payer: Self-pay | Admitting: Family Medicine

## 2014-12-13 ENCOUNTER — Ambulatory Visit (INDEPENDENT_AMBULATORY_CARE_PROVIDER_SITE_OTHER): Payer: Medicare Other | Admitting: Family Medicine

## 2014-12-13 VITALS — BP 145/95 | HR 66 | Temp 97.2°F | Ht 77.0 in | Wt 228.0 lb

## 2014-12-13 DIAGNOSIS — F200 Paranoid schizophrenia: Secondary | ICD-10-CM | POA: Diagnosis not present

## 2014-12-13 DIAGNOSIS — IMO0001 Reserved for inherently not codable concepts without codable children: Secondary | ICD-10-CM

## 2014-12-13 DIAGNOSIS — R350 Frequency of micturition: Secondary | ICD-10-CM

## 2014-12-13 DIAGNOSIS — R030 Elevated blood-pressure reading, without diagnosis of hypertension: Secondary | ICD-10-CM

## 2014-12-13 LAB — POCT URINALYSIS DIPSTICK
Bilirubin, UA: NEGATIVE
GLUCOSE UA: NEGATIVE
Ketones, UA: NEGATIVE
Nitrite, UA: NEGATIVE
UROBILINOGEN UA: NEGATIVE
pH, UA: 5

## 2014-12-13 MED ORDER — CIPROFLOXACIN HCL 500 MG PO TABS: 500.0000 mg | ORAL_TABLET | Freq: Two times a day (BID) | ORAL | Status: DC

## 2014-12-13 NOTE — Progress Notes (Signed)
   Subjective:    Patient ID: Gregory Jackson, male    DOB: Mar 27, 1976, 39 y.o.   MRN: [id number]  Urinary Frequency  Associated symptoms include frequency.    39 year old gentleman. This is first visit here. He does see a psychiatrist and [name] for schizophrenia. Had formally been on Haldol but currently takes Risperdal 2 mg. Patient is accompanied by caseworker today. There is some history of urinary frequency without dysuria without fever or chills or flank pain. There is no prior history of urinary tract infection. Generally he has not been under primary care physician on a psychiatrist. He is not a very reliable historian per the caseworker  Patient is not aware of family history such as blood pressure diabetes but it is noted that his blood pressure is elevated today. I would like to see him back in a month before we initiate treatment and also look at his renal function since he is having proteinuria    Review of Systems  Constitutional: Negative.   HENT: Negative.   Respiratory: Negative.   Cardiovascular: Negative.   Genitourinary: Positive for frequency.  Neurological: Negative.   Psychiatric/Behavioral: Positive for behavioral problems.   Patient Active Problem List   Diagnosis Date Noted  . Syncope 03/09/2013  . Bradycardia 03/09/2013  . Abnormal EKG 03/09/2013  . Hypokalemia 02/29/2012  . Non-compliant patient 02/27/2012  . Schizophrenia, paranoid type 12/27/2011   Outpatient Encounter Prescriptions as of 12/13/2014  Medication Sig  . risperiDONE (RISPERDAL) 2 MG tablet Take 2 mg by mouth at bedtime.  . ciprofloxacin (CIPRO) 500 MG tablet Take 1 tablet (500 mg total) by mouth 2 (two) times daily.  . [DISCONTINUED] haloperidol (HALDOL) 5 MG tablet Take 5 mg by mouth 2 (two) times daily.  . [DISCONTINUED] traZODone (DESYREL) 100 MG tablet Take 100 mg by mouth at bedtime as needed for sleep.   No facility-administered encounter medications on file as of 12/13/2014.         Objective:   Physical Exam  Constitutional: He is oriented to person, place, and time. He appears well-developed and well-nourished.  HENT:  Head: Normocephalic.  Eyes: Pupils are equal, round, and reactive to light.  Cardiovascular: Normal rate and regular rhythm.   Pulmonary/Chest: Effort normal and breath sounds normal.  Musculoskeletal: Normal range of motion.  Neurological: He is alert and oriented to person, place, and time.  Psychiatric:  Patient seems hesitant with answers and does not spontaneously provide information          Assessment & Plan:  1. Urinary frequency Urine has white cells and protein. Will do culture. Begin Cipro pending results of culture - POCT urinalysis dipstick - CMP14+EGFR - Urine culture   2. Paranoid schizophrenia, chronic condition Treated by psychiatry will do lab work today at his request  3. Blood pressure elevated Follow blood pressure but I suspect he may eventually need treatment  Wardell Honour MD

## 2014-12-14 LAB — CMP14+EGFR
ALT: 28 IU/L (ref 0–44)
AST: 27 IU/L (ref 0–40)
Albumin/Globulin Ratio: 2 (ref 1.1–2.5)
Albumin: 4.5 g/dL (ref 3.5–5.5)
Alkaline Phosphatase: 122 IU/L — ABNORMAL HIGH (ref 39–117)
BUN / CREAT RATIO: 14 (ref 9–20)
BUN: 14 mg/dL (ref 6–24)
Bilirubin Total: 0.6 mg/dL (ref 0.0–1.2)
CO2: 23 mmol/L (ref 18–29)
Calcium: 9.7 mg/dL (ref 8.7–10.2)
Chloride: 104 mmol/L (ref 97–108)
Creatinine, Ser: 1.02 mg/dL (ref 0.76–1.27)
GFR calc non Af Amer: 92 mL/min/{1.73_m2} (ref >59)
GFR, EST AFRICAN AMERICAN: 106 mL/min/{1.73_m2} (ref >59)
Globulin, Total: 2.3 g/dL (ref 1.5–4.5)
Glucose: 91 mg/dL (ref 65–99)
Potassium: 4.1 mmol/L (ref 3.5–5.2)
SODIUM: 143 mmol/L (ref 134–144)
TOTAL PROTEIN: 6.8 g/dL (ref 6.0–8.5)

## 2014-12-15 LAB — URINE CULTURE

## 2015-01-25 ENCOUNTER — Ambulatory Visit (INDEPENDENT_AMBULATORY_CARE_PROVIDER_SITE_OTHER): Payer: Medicare Other | Admitting: Family Medicine

## 2015-01-25 ENCOUNTER — Encounter: Payer: Self-pay | Admitting: Family Medicine

## 2015-01-25 VITALS — BP 137/77 | HR 53 | Temp 97.1°F | Ht 77.0 in | Wt 218.0 lb

## 2015-01-25 DIAGNOSIS — R030 Elevated blood-pressure reading, without diagnosis of hypertension: Secondary | ICD-10-CM

## 2015-01-25 DIAGNOSIS — IMO0001 Reserved for inherently not codable concepts without codable children: Secondary | ICD-10-CM | POA: Insufficient documentation

## 2015-01-25 NOTE — Progress Notes (Signed)
   Subjective:    Patient ID: Gregory Jackson, male    DOB: 06-08-1976, 39 y.o.   MRN: [id number]  HPI 39 year old gentleman with schizophrenia. Gregory Jackson returns today to follow-up hypertension. At his last visit his blood pressure was elevated at 145/95. Today's 137/77. Also, since his last visit Gregory Jackson was started on mirtazapine for sleep  Patient Active Problem List   Diagnosis Date Noted  . Syncope 03/09/2013  . Bradycardia 03/09/2013  . Abnormal EKG 03/09/2013  . Hypokalemia 02/29/2012  . Non-compliant patient 02/27/2012  . Schizophrenia, paranoid type 12/27/2011   Outpatient Encounter Prescriptions as of 01/25/2015  Medication Sig  . risperiDONE (RISPERDAL) 2 MG tablet Take 2 mg by mouth at bedtime.  . [DISCONTINUED] ciprofloxacin (CIPRO) 500 MG tablet Take 1 tablet (500 mg total) by mouth 2 (two) times daily.   No facility-administered encounter medications on file as of 01/25/2015.      Review of Systems  Constitutional: Negative.   Respiratory: Negative.   Cardiovascular: Negative.   Psychiatric/Behavioral: Positive for behavioral problems and sleep disturbance.       Objective:   Physical Exam  Constitutional: Gregory Jackson is oriented to person, place, and time. Gregory Jackson appears well-developed and well-nourished.  Cardiovascular: Normal rate and regular rhythm.   Pulmonary/Chest: Effort normal.  Neurological: Gregory Jackson is alert and oriented to person, place, and time.     BP 137/77 mmHg  Pulse 53  Temp(Src) 97.1 F (36.2 C) (Oral)  Ht 6\' 5"  (1.956 m)  Wt 218 lb (98.884 kg)  BMI 25.85 kg/m2      Assessment & Plan:  1. Elevated blood pressure Blood pressure does not require treatment at this time. Gregory Jackson sees mental health professionals monthly and I have asked his caseworker to record blood pressures at those visits and if pressure is above 135/85 in general we may need to reconsider treating.  Frederica KusterStephen M Miller MD

## 2016-01-01 ENCOUNTER — Telehealth: Payer: Self-pay | Admitting: Family Medicine

## 2016-02-27 ENCOUNTER — Telehealth: Payer: Self-pay | Admitting: Family Medicine

## 2016-08-22 ENCOUNTER — Encounter: Payer: Medicare Other | Admitting: Family Medicine

## 2016-08-29 ENCOUNTER — Ambulatory Visit (INDEPENDENT_AMBULATORY_CARE_PROVIDER_SITE_OTHER): Payer: Medicare Other | Admitting: Family Medicine

## 2016-08-29 ENCOUNTER — Encounter: Payer: Self-pay | Admitting: Family Medicine

## 2016-08-29 VITALS — BP 127/76 | HR 61 | Temp 97.5°F | Ht 77.0 in | Wt 265.8 lb

## 2016-08-29 DIAGNOSIS — Z114 Encounter for screening for human immunodeficiency virus [HIV]: Secondary | ICD-10-CM | POA: Diagnosis not present

## 2016-08-29 DIAGNOSIS — Z13220 Encounter for screening for lipoid disorders: Secondary | ICD-10-CM | POA: Diagnosis not present

## 2016-08-29 DIAGNOSIS — Z0000 Encounter for general adult medical examination without abnormal findings: Secondary | ICD-10-CM

## 2016-08-29 DIAGNOSIS — Z23 Encounter for immunization: Secondary | ICD-10-CM | POA: Diagnosis not present

## 2016-08-29 DIAGNOSIS — Z136 Encounter for screening for cardiovascular disorders: Secondary | ICD-10-CM | POA: Diagnosis not present

## 2016-08-29 DIAGNOSIS — Z131 Encounter for screening for diabetes mellitus: Secondary | ICD-10-CM

## 2016-08-29 DIAGNOSIS — M25511 Pain in right shoulder: Secondary | ICD-10-CM

## 2016-08-29 NOTE — Addendum Note (Signed)
Addended by: Almeta MonasSTONE, JANIE M on: 08/29/2016 04:53 PM   Modules accepted: Orders

## 2016-08-29 NOTE — Progress Notes (Signed)
BP 127/76   Pulse 61   Temp 97.5 F (36.4 C) (Oral)   Ht 6' 5"  (1.956 m)   Wt 265 lb 12.8 oz (120.6 kg)   BMI 31.52 kg/m    Subjective:    Patient ID: Gregory Jackson, male    DOB: 12-24-1975, 41 y.o.   MRN: [id number]  HPI: Gregory Jackson is a 41 y.o. male presenting on 08/29/2016 for Annual Exam   HPI Adult well exam Patient is coming in today for an adult well exam and checkup. He says the only major issue that is been having is some right shoulder pain. His shoulder hurts with overhead range of motion and he feels like he cannot go all the way overhead. The pain is in the lateral aspect of the shoulder extends to the top. He denies any numbness or weakness in that arm. He denies any specific incident that brought it out. He has been having issues for at least a few months with this shoulder. He says it hurts a lot more when he's laying on it. Rest and laying on the other side help make it better. He denies any chest pain, shortness of breath, headaches or vision issues, abdominal complaints, diarrhea, nausea, vomiting, or other joint issues.   Relevant past medical, surgical, family and social history reviewed and updated as indicated. Interim medical history since our last visit reviewed. Allergies and medications reviewed and updated.  Review of Systems  Constitutional: Negative for chills and fever.  HENT: Negative for ear pain and tinnitus.   Eyes: Negative for pain.  Respiratory: Negative for cough, shortness of breath and wheezing.   Cardiovascular: Negative for chest pain, palpitations and leg swelling.  Gastrointestinal: Negative for abdominal pain, blood in stool, constipation and diarrhea.  Genitourinary: Negative for dysuria and hematuria.  Musculoskeletal: Positive for arthralgias and myalgias. Negative for back pain and joint swelling.  Skin: Negative for color change and rash.  Neurological: Negative for dizziness, weakness and headaches.    Psychiatric/Behavioral: Negative for suicidal ideas.    Per HPI unless specifically indicated above     Objective:    BP 127/76   Pulse 61   Temp 97.5 F (36.4 C) (Oral)   Ht 6' 5"  (1.956 m)   Wt 265 lb 12.8 oz (120.6 kg)   BMI 31.52 kg/m   Wt Readings from Last 3 Encounters:  08/29/16 265 lb 12.8 oz (120.6 kg)  01/25/15 218 lb (98.9 kg)  12/13/14 228 lb (103.4 kg)    Physical Exam  Constitutional: He is oriented to person, place, and time. He appears well-developed and well-nourished. No distress.  HENT:  Right Ear: External ear normal.  Left Ear: External ear normal.  Nose: Nose normal.  Mouth/Throat: Oropharynx is clear and moist. No oropharyngeal exudate.  Eyes: Conjunctivae and EOM are normal. Pupils are equal, round, and reactive to light. Right eye exhibits no discharge. No scleral icterus.  Neck: Neck supple. No thyromegaly present.  Cardiovascular: Normal rate, regular rhythm, normal heart sounds and intact distal pulses.   No murmur heard. Pulmonary/Chest: Effort normal and breath sounds normal. No respiratory distress. He has no wheezes.  Abdominal: Soft. Bowel sounds are normal. He exhibits no distension. There is no tenderness. There is no rebound and no guarding.  Musculoskeletal: Normal range of motion. He exhibits no edema.       Right shoulder: He exhibits tenderness (Tenderness especially with overhead movement, Hawkins impingement sign positive.). He exhibits normal range of motion,  no bony tenderness, no swelling, normal pulse and normal strength.  Lymphadenopathy:    He has no cervical adenopathy.  Neurological: He is alert and oriented to person, place, and time. Coordination normal.  Skin: Skin is warm and dry. No rash noted. He is not diaphoretic.  Psychiatric: He has a normal mood and affect. His behavior is normal.  Vitals reviewed.     Assessment & Plan:   Problem List Items Addressed This Visit    None    Visit Diagnoses    Well adult  exam    -  Primary   Relevant Orders   CMP14+EGFR   Lipid panel   CBC with Differential/Platelet   HIV antibody   Lipid screening       Relevant Orders   Lipid panel   Diabetes mellitus screening       Relevant Orders   CMP14+EGFR   Screening for HIV without presence of risk factors       Relevant Orders   HIV antibody   Acute pain of right shoulder       Likely impingement syndrome, recommended oral anti-inflammatories for a couple weeks. Return if injection is wanted       Follow up plan: Return in about 1 year (around 08/29/2017), or if symptoms worsen or fail to improve.  Counseling provided for all of the vaccine components Orders Placed This Encounter  Procedures  . CMP14+EGFR  . Lipid panel  . CBC with Differential/Platelet  . HIV antibody    Caryl Pina, MD [facility] 08/29/2016, 2:58 PM

## 2016-08-30 LAB — CMP14+EGFR
A/G RATIO: 1.9 (ref 1.2–2.2)
ALT: 64 IU/L — AB (ref 0–44)
AST: 37 IU/L (ref 0–40)
Albumin: 4.5 g/dL (ref 3.5–5.5)
Alkaline Phosphatase: 165 IU/L — ABNORMAL HIGH (ref 39–117)
BILIRUBIN TOTAL: 0.3 mg/dL (ref 0.0–1.2)
BUN/Creatinine Ratio: 14 (ref 9–20)
BUN: 15 mg/dL (ref 6–24)
CALCIUM: 9.6 mg/dL (ref 8.7–10.2)
CHLORIDE: 100 mmol/L (ref 96–106)
CO2: 23 mmol/L (ref 18–29)
Creatinine, Ser: 1.08 mg/dL (ref 0.76–1.27)
GFR calc Af Amer: 98 mL/min/{1.73_m2} (ref >59)
GFR calc non Af Amer: 85 mL/min/{1.73_m2} (ref >59)
Globulin, Total: 2.4 g/dL (ref 1.5–4.5)
Glucose: 84 mg/dL (ref 65–99)
POTASSIUM: 4.2 mmol/L (ref 3.5–5.2)
Sodium: 140 mmol/L (ref 134–144)
Total Protein: 6.9 g/dL (ref 6.0–8.5)

## 2016-08-30 LAB — CBC WITH DIFFERENTIAL/PLATELET
BASOS: 1 %
Basophils Absolute: 0 10*3/uL (ref 0.0–0.2)
EOS (ABSOLUTE): 0.3 10*3/uL (ref 0.0–0.4)
EOS: 3 %
HEMATOCRIT: 48.6 % (ref 37.5–51.0)
HEMOGLOBIN: 17.1 g/dL (ref 13.0–17.7)
Immature Grans (Abs): 0 10*3/uL (ref 0.0–0.1)
Immature Granulocytes: 0 %
Lymphocytes Absolute: 2.8 10*3/uL (ref 0.7–3.1)
Lymphs: 34 %
MCH: 30.6 pg (ref 26.6–33.0)
MCHC: 35.2 g/dL (ref 31.5–35.7)
MCV: 87 fL (ref 79–97)
MONOCYTES: 8 %
Monocytes Absolute: 0.7 10*3/uL (ref 0.1–0.9)
NEUTROS ABS: 4.5 10*3/uL (ref 1.4–7.0)
Neutrophils: 54 %
Platelets: 222 10*3/uL (ref 150–379)
RBC: 5.58 x10E6/uL (ref 4.14–5.80)
RDW: 14.1 % (ref 12.3–15.4)
WBC: 8.3 10*3/uL (ref 3.4–10.8)

## 2016-08-30 LAB — LIPID PANEL
Chol/HDL Ratio: 5.3 ratio units — ABNORMAL HIGH (ref 0.0–5.0)
Cholesterol, Total: 175 mg/dL (ref 100–199)
HDL: 33 mg/dL — ABNORMAL LOW (ref >39)
LDL Calculated: 110 mg/dL — ABNORMAL HIGH (ref 0–99)
TRIGLYCERIDES: 158 mg/dL — AB (ref 0–149)
VLDL Cholesterol Cal: 32 mg/dL (ref 5–40)

## 2016-08-30 LAB — HIV ANTIBODY (ROUTINE TESTING W REFLEX): HIV Screen 4th Generation wRfx: NONREACTIVE

## 2016-09-03 LAB — HEPATITIS PANEL, ACUTE
HEP B C IGM: NEGATIVE
HEP B S AG: NEGATIVE
Hep A IgM: NEGATIVE
Hep C Virus Ab: <0.1 s/co ratio (ref 0.0–0.9)

## 2016-09-03 LAB — SPECIMEN STATUS REPORT

## 2017-05-23 ENCOUNTER — Ambulatory Visit (INDEPENDENT_AMBULATORY_CARE_PROVIDER_SITE_OTHER): Payer: Medicare Other | Admitting: Family Medicine

## 2017-05-23 ENCOUNTER — Encounter (INDEPENDENT_AMBULATORY_CARE_PROVIDER_SITE_OTHER): Payer: Self-pay

## 2017-05-23 VITALS — BP 149/90 | HR 61 | Temp 96.8°F | Ht 77.0 in | Wt 245.2 lb

## 2017-05-23 DIAGNOSIS — J0110 Acute frontal sinusitis, unspecified: Secondary | ICD-10-CM | POA: Diagnosis not present

## 2017-05-23 MED ORDER — AMOXICILLIN-POT CLAVULANATE 875-125 MG PO TABS: 1.0000 | ORAL_TABLET | Freq: Two times a day (BID) | ORAL | 0 refills | Status: DC

## 2017-05-23 MED ORDER — FLUTICASONE PROPIONATE 50 MCG/ACT NA SUSP: 2.0000 | Freq: Every day | NASAL | 6 refills | Status: DC

## 2017-05-23 NOTE — Patient Instructions (Addendum)
Great to meet you!  Come back with any concerns  Finsh all antibiotics  Start flonase for a couple of weeks, 2 sprays per nostril once daily.    Sinusitis, Adult Sinusitis is soreness and inflammation of your sinuses. Sinuses are hollow spaces in the bones around your face. They are located:  Around your eyes.  In the middle of your forehead.  Behind your nose.  In your cheekbones.  Your sinuses and nasal passages are lined with a stringy fluid (mucus). Mucus normally drains out of your sinuses. When your nasal tissues get inflamed or swollen, the mucus can get trapped or blocked so air cannot flow through your sinuses. This lets bacteria, viruses, and funguses grow, and that leads to infection. Follow these instructions at home: Medicines  Take, use, or apply over-the-counter and prescription medicines only as told by your doctor. These may include nasal sprays.  If you were prescribed an antibiotic medicine, take it as told by your doctor. Do not stop taking the antibiotic even if you start to feel better. Hydrate and Humidify  Drink enough water to keep your pee (urine) clear or pale yellow.  Use a cool mist humidifier to keep the humidity level in your home above 50%.  Breathe in steam for 10-15 minutes, 3-4 times a day or as told by your doctor. You can do this in the bathroom while a hot shower is running.  Try not to spend time in cool or dry air. Rest  Rest as much as possible.  Sleep with your head raised (elevated).  Make sure to get enough sleep each night. General instructions  Put a warm, moist washcloth on your face 3-4 times a day or as told by your doctor. This will help with discomfort.  Wash your hands often with soap and water. If there is no soap and water, use hand sanitizer.  Do not smoke. Avoid being around people who are smoking (secondhand smoke).  Keep all follow-up visits as told by your doctor. This is important. Contact a doctor  if:  You have a fever.  Your symptoms get worse.  Your symptoms do not get better within 10 days. Get help right away if:  You have a very bad headache.  You cannot stop throwing up (vomiting).  You have pain or swelling around your face or eyes.  You have trouble seeing.  You feel confused.  Your neck is stiff.  You have trouble breathing. This information is not intended to replace advice given to you by your health care provider. Make sure you discuss any questions you have with your health care provider. Document Released: 12/18/2007 Document Revised: 02/25/2016 Document Reviewed: 04/26/2015 Elsevier Interactive Patient Education  [employer]2018 [employer]

## 2017-05-23 NOTE — Progress Notes (Signed)
   HPI  Patient presents today with cough and sinus pressure.  Patient states he has had cough with mild shortness of breath and frontal sinus pressure for about 2 weeks.  He states the sinus pressure causes headaches that are worse in the morning.  He is tried I[employer] IndustriesFlonase with no improvement.  He reports good food and fluid intake and states that he is eating like usual.  He denies fever, chills, and sweats.  He denies chest pain.  PMH: Smoking status noted ROS: Per HPI  Objective: BP (!) 149/90   Pulse 61   Temp (!) 96.8 F (36 C) (Oral)   Ht 6\' 5"  (1.956 m)   Wt 245 lb 3.2 oz (111.2 kg)   BMI 29.08 kg/m  Gen: NAD, alert, cooperative with exam HEENT: NCAT, no tenderness to palpation of maxillary or frontal sinuses, TMs normal bilaterally, oropharynx moist and clear, nares with swollen turbinates bilaterally CV: RRR, good S1/S2, no murmur Resp: R Lower lung field with persistent rhonchi, good air movement Ext: No edema, warm Neuro: Alert and oriented, No gross deficits  Assessment and plan:  #Acute sinusitis Patient with persistent sinus pressure, although no tenderness on exam he does also have some rhonchi in the right lower lung field, treat with Augmentin. Also start Flonase. Return to clinic with any concerns or worsening symptoms    Meds ordered this encounter  Medications  . RisperiDONE Microspheres (RISPERDAL CONSTA IM)    Sig: Inject into the muscle.  Marland Kitchen. amoxicillin-clavulanate (AUGMENTIN) 875-125 MG tablet    Sig: Take 1 tablet 2 (two) times daily by mouth.    Dispense:  20 tablet    Refill:  0  . fluticasone (FLONASE) 50 MCG/ACT nasal spray    Sig: Place 2 sprays daily into both nostrils.    Dispense:  16 g    Refill:  6    Murtis SinkSam Verlene Glantz, MD [facility] 05/23/2017, 9:54 AM

## 2018-01-01 ENCOUNTER — Encounter: Payer: Self-pay | Admitting: Family

## 2018-01-01 ENCOUNTER — Ambulatory Visit (INDEPENDENT_AMBULATORY_CARE_PROVIDER_SITE_OTHER): Payer: Medicare Other | Admitting: Family

## 2018-01-01 VITALS — BP 130/75 | HR 65 | Temp 98.7°F | Ht 77.0 in | Wt 246.2 lb

## 2018-01-01 DIAGNOSIS — F17200 Nicotine dependence, unspecified, uncomplicated: Secondary | ICD-10-CM

## 2018-01-01 DIAGNOSIS — J208 Acute bronchitis due to other specified organisms: Secondary | ICD-10-CM | POA: Diagnosis not present

## 2018-01-01 DIAGNOSIS — B9689 Other specified bacterial agents as the cause of diseases classified elsewhere: Secondary | ICD-10-CM

## 2018-01-01 MED ORDER — AMOXICILLIN-POT CLAVULANATE 875-125 MG PO TABS: 1.0000 | ORAL_TABLET | Freq: Two times a day (BID) | ORAL | 0 refills | Status: DC

## 2018-01-01 NOTE — Progress Notes (Signed)
   Subjective:    Patient ID: Gregory Jackson, male    DOB: 11-Nov-1975, 42 y.o.   MRN: [id number]  Chief Complaint  Patient presents with  . Nasal Congestion    thick discharge, sneezing  . Cough    Cough  This is a new problem. The current episode started 1 to 4 weeks ago. The problem has been gradually worsening. The problem occurs every few minutes. The cough is productive of sputum and productive of purulent sputum. Associated symptoms include chills, headaches, nasal congestion, postnasal drip, rhinorrhea, a sore throat and shortness of breath. Pertinent negatives include no ear congestion, ear pain, fever, myalgias or wheezing. He has tried rest and OTC cough suppressant for the symptoms. The treatment provided mild relief.      Review of Systems  Constitutional: Positive for chills. Negative for fever.  HENT: Positive for postnasal drip, rhinorrhea and sore throat. Negative for ear pain.   Respiratory: Positive for cough and shortness of breath. Negative for wheezing.   Musculoskeletal: Negative for myalgias.  Neurological: Positive for headaches.  All other systems reviewed and are negative.      Objective:   Physical Exam  Constitutional: He is oriented to person, place, and time. He appears well-developed and well-nourished. No distress.  HENT:  Head: Normocephalic.  Right Ear: External ear normal. Tympanic membrane is erythematous.  Left Ear: External ear normal.  Mouth/Throat: Oropharynx is clear and moist.  Eyes: Pupils are equal, round, and reactive to light. Right eye exhibits no discharge. Left eye exhibits no discharge.  Neck: Normal range of motion. Neck supple. No thyromegaly present.  Cardiovascular: Normal rate, regular rhythm, normal heart sounds and intact distal pulses.  No murmur heard. Pulmonary/Chest: Effort normal and breath sounds normal. No respiratory distress. He has no wheezes.  Intermittent coarse nonproductive cough  Abdominal: Soft. Bowel  sounds are normal. He exhibits no distension. There is no tenderness.  Musculoskeletal: Normal range of motion. He exhibits no edema or tenderness.  Neurological: He is alert and oriented to person, place, and time. He has normal reflexes. No cranial nerve deficit.  Skin: Skin is warm and dry. No rash noted. No erythema.  Psychiatric: He has a normal mood and affect. His behavior is normal. Judgment and thought content normal.  Vitals reviewed.     BP 130/75   Pulse 65   Temp 98.7 F (37.1 C) (Oral)   Ht 6\' 5"  (1.956 m)   Wt 246 lb 3.2 oz (111.7 kg)   BMI 29.20 kg/m      Assessment & Plan:  Onalee HuaDavid was seen today for nasal congestion and cough.  Diagnoses and all orders for this visit:  Acute bronchitis, bacterial -     amoxicillin-clavulanate (AUGMENTIN) 875-125 MG tablet; Take 1 tablet by mouth 2 (two) times daily.  Smoker   - Take meds as prescribed - Use a cool mist humidifier  -Use saline nose sprays frequently -Force fluids -For any cough or congestion  Use plain Mucinex- regular strength or max strength is fine -For fever or aces or pains- take tylenol or ibuprofen. -Throat lozenges if help RTO if symptoms worsen or do not improve  Jannifer Rodneyhristy Herny Scurlock, FNP

## 2018-01-01 NOTE — Patient Instructions (Signed)

## 2018-02-13 ENCOUNTER — Telehealth: Payer: Self-pay | Admitting: Family Medicine

## 2018-02-13 ENCOUNTER — Encounter: Payer: Self-pay | Admitting: Family Medicine

## 2018-02-13 NOTE — Telephone Encounter (Signed)
Will provide note and place up front

## 2018-02-13 NOTE — Telephone Encounter (Signed)
Letter ready for pick up. [email or web address]

## 2018-03-20 ENCOUNTER — Telehealth: Payer: Self-pay | Admitting: Family Medicine

## 2018-12-24 ENCOUNTER — Encounter (INDEPENDENT_AMBULATORY_CARE_PROVIDER_SITE_OTHER): Payer: Self-pay

## 2019-05-13 ENCOUNTER — Telehealth: Payer: Self-pay | Admitting: Family Medicine

## 2019-05-14 ENCOUNTER — Ambulatory Visit (INDEPENDENT_AMBULATORY_CARE_PROVIDER_SITE_OTHER): Payer: Medicare Other

## 2019-05-14 DIAGNOSIS — Z23 Encounter for immunization: Secondary | ICD-10-CM | POA: Diagnosis not present

## 2020-09-06 ENCOUNTER — Other Ambulatory Visit: Payer: Self-pay

## 2020-09-06 ENCOUNTER — Ambulatory Visit (INDEPENDENT_AMBULATORY_CARE_PROVIDER_SITE_OTHER): Payer: Medicare Other

## 2020-09-06 DIAGNOSIS — Z23 Encounter for immunization: Secondary | ICD-10-CM | POA: Diagnosis not present

## 2020-09-06 NOTE — Progress Notes (Signed)
   Covid-19 Vaccination Clinic  Name:  Gregory Jackson    MRN: [id number] DOB: 08-14-1975  09/06/2020  Mr. Gregory Jackson was observed post Covid-19 immunization for 15 minutes without incident. He was provided with Vaccine Information Sheet and instruction to access the V-Safe system.   Mr. Gregory Jackson was instructed to call 911 with any severe reactions post vaccine: Marland Kitchen Difficulty breathing  . Swelling of face and throat  . A fast heartbeat  . A bad rash all over body  . Dizziness and weakness   Immunizations Administered    Name Date Dose VIS Date Route   PFIZER Comrnaty(Gray TOP) Covid-19 Vaccine 09/06/2020  3:21 PM 0.3 mL 06/22/2020 Intramuscular   Manufacturer: [employer], [employer]   Lot: [id number]   NDC: [phone]

## 2020-09-15 ENCOUNTER — Ambulatory Visit: Payer: Medicare Other | Admitting: Allergy & Immunology

## 2020-10-20 ENCOUNTER — Encounter: Payer: Self-pay | Admitting: Allergy & Immunology

## 2020-10-20 ENCOUNTER — Other Ambulatory Visit: Payer: Self-pay

## 2020-10-20 ENCOUNTER — Ambulatory Visit (INDEPENDENT_AMBULATORY_CARE_PROVIDER_SITE_OTHER): Payer: Medicare Other | Admitting: Allergy & Immunology

## 2020-10-20 VITALS — BP 138/80 | HR 63 | Temp 98.3°F | Resp 17 | Ht 77.0 in | Wt 278.6 lb

## 2020-10-20 DIAGNOSIS — J302 Other seasonal allergic rhinitis: Secondary | ICD-10-CM

## 2020-10-20 DIAGNOSIS — J3089 Other allergic rhinitis: Secondary | ICD-10-CM

## 2020-10-20 DIAGNOSIS — K9049 Malabsorption due to intolerance, not elsewhere classified: Secondary | ICD-10-CM

## 2020-10-20 MED ORDER — FLUTICASONE PROPIONATE 50 MCG/ACT NA SUSP: NASAL | 5 refills | Status: DC

## 2020-10-20 MED ORDER — CETIRIZINE HCL 10 MG PO TABS: ORAL_TABLET | ORAL | 5 refills | Status: DC

## 2020-10-20 NOTE — Patient Instructions (Addendum)
1. Seasonal and perennial allergic rhinitis - Testing today showed: grasses, trees, indoor molds, outdoor molds, cat and dog - Copy of test results provided.  - Avoidance measures provided. - Start taking: Zyrtec (cetirizine) 10mg  tablet once daily and Flonase (fluticasone) one spray per nostril daily - You can use an extra dose of the antihistamine, if needed, for breakthrough symptoms.  - Consider nasal saline rinses 1-2 times daily to remove allergens from the nasal cavities as well as help with mucous clearance (this is especially helpful to do before the nasal sprays are given)  - Honestly, stopping the smoking will help a lot with the congestion since the cigarette smoke interferes with the mucous clearance of the airways, so this will gradually get better and better as you continue to NOT smoke.   2. Food intolerance - Testing was negative to the most common foods. - There is a the low positive predictive value of food allergy testing and hence the high possibility of false positives. - In contrast, food allergy testing has a high negative predictive value, therefore if testing is negative we can be relatively assured that they are indeed negative.  - There is no need to avoid any foods at all.  3. Return in about 3 months (around 01/19/2021).    Please inform 03/22/2021 of any Emergency Department visits, hospitalizations, or changes in symptoms. Call us before going to the ED for breathing or allergy symptoms since we might be able to fit you in for a sick visit. Feel free to contact us anytime with any questions, problems, or concerns.  It was a pleasure to meet you today! Tell your father we said HI!   Websites that have reliable patient information: 1. American Academy of Asthma, Allergy, and Immunology: www.aaaai.org 2. Food Allergy Research and Education (FARE): foodallergy.org 3. Mothers of Asthmatics: http://www.asthmacommunitynetwork.org 4. American College of Allergy, Asthma, and  Immunology: www.acaai.org   COVID-19 Vaccine Information can be found at: [place] For questions related to vaccine distribution or appointments, please email [email or web address] or call [phone].   We realize that you might be concerned about having an allergic reaction to the COVID19 vaccines. To help with that concern, WE ARE OFFERING THE COVID19 VACCINES IN OUR OFFICE! Ask the front desk for dates!     "Like" [phone] on Facebook and Instagram for our latest updates!      A healthy democracy works best when [place] participate! Make sure you are registered to vote! If you have moved or changed any of your contact information, you will need to get this updated before voting!  In some cases, you MAY be able to register to vote online: [employer]    1. Control-Buffer 50% Glycerol Negative   2. Control-Histamine 1 mg/ml 2+   3. Albumin saline Negative   4. Bahia Negative   5. [email or web address] Negative   6. Johnson Negative   7. Kentucky Blue Negative   8. Meadow Fescue Negative   9. Perennial Rye Negative   10. Sweet Vernal Negative   11. Timothy Negative   12. Cocklebur Negative   13. Burweed Marshelder Negative   14. Ragweed, short Negative   15. Ragweed, Giant Negative   16. Plantain,  English Negative   17. Lamb's Quarters Negative   18. Sheep Sorrell Negative   19. Rough Pigweed Negative   20. Marsh Elder, Rough Negative   21. Mugwort, Common Negative   22. Ash mix Negative   23. Birch mix Negative   24. [place]  American Negative   25. Box, Elder Negative   26. Cedar, red Negative   27. Cottonwood, Guinea[place] Negative   28. Elm mix Negative   29. Hickory Negative   30. Maple mix Negative   31. Oak, [place] mix Negative   32. Pecan Pollen Negative   33. Pine mix Negative   34. Sycamore Eastern Negative   35. Walnut, Black Pollen Negative   36. Alternaria alternata  Negative   37. Cladosporium Herbarum Negative   38. Aspergillus mix Negative   39. Penicillium mix Negative   40. Bipolaris sorokiniana (Helminthosporium) Negative   41. Drechslera spicifera (Curvularia) Negative   42. Mucor plumbeus Negative   43. Fusarium moniliforme Negative   44. Aureobasidium pullulans (pullulara) Negative   45. Rhizopus oryzae Negative   46. Botrytis cinera Negative   47. Epicoccum nigrum Negative   48. Phoma betae Negative   49. Candida Albicans Negative   50. Trichophyton mentagrophytes Negative   51. Mite, D Farinae  5,000 AU/ml Negative   52. Mite, D Pteronyssinus  5,000 AU/ml Negative   53. Cat Hair 10,000 BAU/ml Negative   54.  Dog Epithelia Negative   55. Mixed Feathers Negative   56. Horse Epithelia Negative   57. Cockroach, German Negative   58. Mouse Negative   59. Tobacco Leaf Negative    1. Peanut Negative   2. Soybean food Negative   3. Wheat, whole Negative   4. Sesame Negative   5. Milk, cow Negative   6. Egg White, chicken Negative   7. Casein Negative   8. Shellfish mix Negative   9. Fish mix Negative   10. Cashew Negative     Control Negative   [place] Negative   Johnson Negative   7 Grass 1+   Ragweed mix Negative   Weed mix Negative   Tree mix 1+   Mold 1 1+   Mold 2 Negative   Mold 3 Negative   Mold 4 1+   Cat 1+   Dog 2+   Cockroach Negative   Mite mix Negative    Reducing Pollen Exposure  The American Academy of Allergy, Asthma and Immunology suggests the following steps to reduce your exposure to pollen during allergy seasons.    1. Do not hang sheets or clothing out to dry; pollen may collect on these items. 2. Do not mow lawns or spend time around freshly cut grass; mowing stirs up pollen. 3. Keep windows closed at night.  Keep car windows closed while driving. 4. Minimize morning activities outdoors, a time when pollen counts are usually at their highest. 5. Stay indoors as much as possible when pollen  counts or humidity is high and on windy days when pollen tends to remain in the air longer. 6. Use air conditioning when possible.  Many air conditioners have filters that trap the pollen spores. 7. Use a HEPA room air filter to remove pollen form the indoor air you breathe.  Control of Mold Allergen   Mold and fungi can grow on a variety of surfaces provided certain temperature and moisture conditions exist.  Outdoor molds grow on plants, decaying vegetation and soil.  The major outdoor mold, Alternaria and Cladosporium, are found in very high numbers during hot and dry conditions.  Generally, a late Summer - Fall peak is seen for common outdoor fungal spores.  Rain will temporarily lower outdoor mold spore count, but counts rise rapidly when the rainy period ends.  The most important indoor molds  are Aspergillus and Penicillium.  Dark, humid and poorly ventilated basements are ideal sites for mold growth.  The next most common sites of mold growth are the bathroom and the kitchen.  Outdoor (Seasonal) Mold Control  Positive outdoor molds via skin testing: Alternaria and Cladosporium  1. Use air conditioning and keep windows closed 2. Avoid exposure to decaying vegetation. 3. Avoid leaf raking. 4. Avoid grain handling. 5. Consider wearing a face mask if working in moldy areas.  6.   Indoor (Perennial) Mold Control   Positive indoor molds via skin testing: Fusarium, Aureobasidium (Pullulara) and Rhizopus  1. Maintain humidity below 50%. 2. Clean washable surfaces with 5% bleach solution. 3. Remove sources e.g. contaminated carpets.     Control of Dog or Cat Allergen  Avoidance is the best way to manage a dog or cat allergy. If you have a dog or cat and are allergic to dog or cats, consider removing the dog or cat from the home. If you have a dog or cat but don't want to find it a new home, or if your family wants a pet even though someone in the household is allergic, here are some  strategies that may help keep symptoms at bay:  1. Keep the pet out of your bedroom and restrict it to only a few rooms. Be advised that keeping the dog or cat in only one room will not limit the allergens to that room. 2. Don't pet, hug or kiss the dog or cat; if you do, wash your hands with soap and water. 3. High-efficiency particulate air (HEPA) cleaners run continuously in a bedroom or living room can reduce allergen levels over time. 4. Regular use of a high-efficiency vacuum cleaner or a central vacuum can reduce allergen levels. 5. Giving your dog or cat a bath at least once a week can reduce airborne allergen.  Allergy Shots   Allergies are the result of a chain reaction that starts in the immune system. Your immune system controls how your body defends itself. For instance, if you have an allergy to pollen, your immune system identifies pollen as an invader or allergen. Your immune system overreacts by producing antibodies called Immunoglobulin E (IgE). These antibodies travel to cells that release chemicals, causing an allergic reaction.  The concept behind allergy immunotherapy, whether it is received in the form of shots or tablets, is that the immune system can be desensitized to specific allergens that trigger allergy symptoms. Although it requires time and patience, the payback can be long-term relief.  How Do Allergy Shots Work?  Allergy shots work much like a vaccine. Your body responds to injected amounts of a particular allergen given in increasing doses, eventually developing a resistance and tolerance to it. Allergy shots can lead to decreased, minimal or no allergy symptoms.  There generally are two phases: build-up and maintenance. Build-up often ranges from three to six months and involves receiving injections with increasing amounts of the allergens. The shots are typically given once or twice a week, though more rapid build-up schedules are sometimes used.  The  maintenance phase begins when the most effective dose is reached. This dose is different for each person, depending on how allergic you are and your response to the build-up injections. Once the maintenance dose is reached, there are longer periods between injections, typically two to four weeks.  Occasionally doctors give cortisone-type shots that can temporarily reduce allergy symptoms. These types of shots are different and should not be confused  with allergy immunotherapy shots.  Who Can Be Treated with Allergy Shots?  Allergy shots may be a good treatment approach for people with allergic rhinitis (hay fever), allergic asthma, conjunctivitis (eye allergy) or stinging insect allergy.   Before deciding to begin allergy shots, you should consider:  . The length of allergy season and the severity of your symptoms . Whether medications and/or changes to your environment can control your symptoms . Your desire to avoid long-term medication use . Time: allergy immunotherapy requires a major time commitment . Cost: may vary depending on your insurance coverage  Allergy shots for children age 40five and older are effective and often well tolerated. They might prevent the onset of new allergen sensitivities or the progression to asthma.  Allergy shots are not started on patients who are pregnant but can be continued on patients who become pregnant while receiving them. In some patients with other medical conditions or who take certain common medications, allergy shots may be of risk. It is important to mention other medications you talk to your allergist.   When Will I Feel Better?  Some may experience decreased allergy symptoms during the build-up phase. For others, it may take as long as 12 months on the maintenance dose. If there is no improvement after a year of maintenance, your allergist will discuss other treatment options with you.  If you aren't responding to allergy shots, it may be  because there is not enough dose of the allergen in your vaccine or there are missing allergens that were not identified during your allergy testing. Other reasons could be that there are high levels of the allergen in your environment or major exposure to non-allergic triggers like tobacco smoke.  What Is the Length of Treatment?  Once the maintenance dose is reached, allergy shots are generally continued for three to five years. The decision to stop should be discussed with your allergist at that time. Some people may experience a permanent reduction of allergy symptoms. Others may relapse and a longer course of allergy shots can be considered.  What Are the Possible Reactions?  The two types of adverse reactions that can occur with allergy shots are local and systemic. Common local reactions include very mild redness and swelling at the injection site, which can happen immediately or several hours after. A systemic reaction, which is less common, affects the entire body or a particular body system. They are usually mild and typically respond quickly to medications. Signs include increased allergy symptoms such as sneezing, a stuffy nose or hives.  Rarely, a serious systemic reaction called anaphylaxis can develop. Symptoms include swelling in the throat, wheezing, a feeling of tightness in the chest, nausea or dizziness. Most serious systemic reactions develop within 30 minutes of allergy shots. This is why it is strongly recommended you wait in your doctor's office for 30 minutes after your injections. Your allergist is trained to watch for reactions, and his or her staff is trained and equipped with the proper medications to identify and treat them.  Who Should Administer Allergy Shots?  The preferred location for receiving shots is your prescribing allergist's office. Injections can sometimes be given at another facility where the physician and staff are trained to recognize and treat reactions,  and have received instructions by your prescribing allergist.

## 2020-10-20 NOTE — Progress Notes (Signed)
NEW PATIENT  Date of Service/Encounter:  10/20/20  Consult requested by: Dettinger, Elige RadonJoshua A, MD   Assessment:   Seasonal and perennial allergic rhinitis (grasses, trees, indoor molds, outdoor molds, cat and dog)  Food intolerance - with negative testing to the most common foods  Previous smoker - has quit for one week   Plan/Recommendations:   1. Seasonal and perennial allergic rhinitis - Testing today showed: grasses, trees, indoor molds, outdoor molds, cat and dog - Copy of test results provided.  - Avoidance measures provided. - Start taking: Zyrtec (cetirizine) 10mg  tablet once daily and Flonase (fluticasone) one spray per nostril daily - You can use an extra dose of the antihistamine, if needed, for breakthrough symptoms.  - Consider nasal saline rinses 1-2 times daily to remove allergens from the nasal cavities as well as help with mucous clearance (this is especially helpful to do before the nasal sprays are given)  - Honestly, stopping the smoking will help a lot with the congestion since the cigarette smoke interferes with the mucous clearance of the airways, so this will gradually get better and better as you continue to NOT smoke.   2. Food intolerance - Testing was negative to the most common foods. - There is a the low positive predictive value of food allergy testing and hence the high possibility of false positives. - In contrast, food allergy testing has a high negative predictive value, therefore if testing is negative we can be relatively assured that they are indeed negative.  - There is no need to avoid any foods at all.  3. Return in about 3 months (around 01/19/2021).   This note in its entirety was forwarded to the Provider who requested this consultation.  Subjective:   Gregory JacobsonDavid L Jackson is a 45 y.o. male presenting today for evaluation of  Chief Complaint  Patient presents with  . Allergy Testing    Gregory JacobsonDavid L Wohl has a history of the  following: Patient Active Problem List   Diagnosis Date Noted  . Elevated blood pressure 01/25/2015  . Syncope 03/09/2013  . Bradycardia 03/09/2013  . Abnormal EKG 03/09/2013  . Hypokalemia 02/29/2012  . Non-compliant patient 02/27/2012  . Schizophrenia, paranoid type (HCC) 12/27/2011    History obtained from: chart review and patient and brother.  Gregory Jackson was referred by Dettinger, Elige RadonJoshua A, MD.     Gregory Jackson is a 45 y.o. male presenting for an evaluation of chronic rhinitis.  He has a lot of sinus problems. He has had these issues for 3-4 years or more. He reports some swelling of his face. He has not tried using nose sprays or antihistamines. He has rhinorrhea that is worse during all of the seasons. He has never been tested for allergies. He does not go to doctors "on the regular". He does have some sinus infections.  He does not get treated for sinusitis because he does not have her is seen for them.  He has never been allergy tested in the past.  His mother reports that he is worried about food allergies.  However, he does not really going to detail about his reaction to foods.  He just wants to be tested "just to be sure".   He was a smoker for several years and has now quit.  He had quit 1 week ago and is using patches.  Proudly shows me the patch on his right arm.  He does report feeling better from a congestion standpoint since stopping smoking.  He has never been diagnosed with COPD.  Has never needed any inhalers or nebulizers.  He is currently on disability for schizophrenia.  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  He then disappeared to [place] somewhere in there and was admitted to a psychiatric unit up there.  He has now stabilized and living with his father.  Otherwise, there is no history of other atopic diseases, including drug allergies, stinging insect allergies, eczema, urticaria or contact  dermatitis. There is no significant infectious history. Vaccinations are up to date.    Past Medical History: Patient Active Problem List   Diagnosis Date Noted  . Elevated blood pressure 01/25/2015  . Syncope 03/09/2013  . Bradycardia 03/09/2013  . Abnormal EKG 03/09/2013  . Hypokalemia 02/29/2012  . Non-compliant patient 02/27/2012  . Schizophrenia, paranoid type (HCC) 12/27/2011    Medication List:  Allergies as of 10/20/2020      Reactions   Chocolate Rash   Milk-related Compounds Rash      Medication List       Accurate as of October 20, 2020  4:40 PM. If you have any questions, ask your nurse or doctor.        amoxicillin-clavulanate 875-125 MG tablet Commonly known as: AUGMENTIN Take 1 tablet by mouth 2 (two) times daily.   Invega Sustenna 156 MG/ML Susy injection Generic drug: paliperidone INJECT ONE syringe Intramuscularly ONCE EVERY MONTH   Invega Trinza 546 MG/1.75ML Susy Generic drug: Paliperidone Palmitate ER INJECT ONE syringe Intramuscularly EVERY 3 MONTHS   propranolol 10 MG tablet Commonly known as: INDERAL Take 10 mg by mouth 2 (two) times daily.   RISPERDAL CONSTA IM Inject into the muscle.   traZODone 50 MG tablet Commonly known as: DESYREL Take 100 mg at bedtime by mouth.       Birth History: non-contributory  Developmental History: non-contributory  Past Surgical History: History reviewed. No pertinent surgical history.   Family History: Family History  Problem Relation Age of Onset  . Heart disease Mother      Social History: Gregory Jackson lives at home with his father, [name], who is also one of our patients.  He lives in a mobile home that is around 46 years old.  There is no mildew damage.  There is carpeting throughout the home.  He has electric heating and has ceiling fans and window units for cooling.  There is a cat outside of the home.  There are no dust mite covers on the bedding.  There is tobacco exposure both in the home in  the car.  He is currently on disability.  There is no exposure to fumes, chemicals, or dust.  He smokes 1 pack/day for the past 30 years.   ROS     Objective:   Blood pressure 138/80, pulse 63, temperature 98.3 F (36.8 C), temperature source Temporal, resp. rate 17, height 6\' 5"  (1.956 m), weight 278 lb 9.6 oz (126.4 kg), SpO2 97 %. Body mass index is 33.04 kg/m.   Physical Exam:   Physical Exam Constitutional:      Appearance: He is well-developed.     Comments: Somewhat gruff.  HENT:     Head: Normocephalic and atraumatic.     Right Ear: Tympanic membrane, ear canal and external ear normal. No drainage, swelling or tenderness. Tympanic membrane is not injected, scarred, erythematous, retracted or bulging.     Left Ear: Tympanic membrane, ear canal and  external ear normal. No drainage, swelling or tenderness. Tympanic membrane is not injected, scarred, erythematous, retracted or bulging.     Nose: No nasal deformity, septal deviation, mucosal edema or rhinorrhea.     Right Turbinates: Enlarged, swollen and pale.     Left Turbinates: Enlarged, swollen and pale.     Right Sinus: No maxillary sinus tenderness or frontal sinus tenderness.     Left Sinus: No maxillary sinus tenderness or frontal sinus tenderness.     Comments: No nasal polyps.  Some clear mucus.    Mouth/Throat:     Mouth: Mucous membranes are not pale and not dry.     Pharynx: Uvula midline.  Eyes:     General:        Right eye: No discharge.        Left eye: No discharge.     Conjunctiva/sclera: Conjunctivae normal.     Right eye: Right conjunctiva is not injected. No chemosis.    Left eye: Left conjunctiva is not injected. No chemosis.    Pupils: Pupils are equal, round, and reactive to light.  Cardiovascular:     Rate and Rhythm: Normal rate and regular rhythm.     Heart sounds: Normal heart sounds.  Pulmonary:     Effort: Pulmonary effort is normal. No tachypnea, accessory muscle usage or respiratory  distress.     Breath sounds: Normal breath sounds. No wheezing, rhonchi or rales.     Comments: Moving air well in all lung fields.  Breathing very comfortably. Chest:     Chest wall: No tenderness.  Abdominal:     Tenderness: There is no abdominal tenderness. There is no guarding or rebound.  Lymphadenopathy:     Head:     Right side of head: No submandibular, tonsillar or occipital adenopathy.     Left side of head: No submandibular, tonsillar or occipital adenopathy.     Cervical: No cervical adenopathy.  Skin:    Coloration: Skin is not pale.     Findings: No abrasion, erythema, petechiae or rash. Rash is not papular, urticarial or vesicular.  Neurological:     Mental Status: He is alert.  Psychiatric:        Behavior: Behavior is cooperative.      Diagnostic studies:   Allergy Studies:     Airborne Adult Perc - 10/20/20 1415    Time Antigen Placed 1415    Allergen Manufacturer Waynette Buttery    Location Back    Number of Test 59    Panel 1 Select    1. Control-Buffer 50% Glycerol Negative    2. Control-Histamine 1 mg/ml 2+    3. Albumin saline Negative    4. Bahia Negative    5. French Southern Territories Negative    6. Johnson Negative    7. Kentucky Blue Negative    8. Meadow Fescue Negative    9. Perennial Rye Negative    10. Sweet Vernal Negative    11. Timothy Negative    12. Cocklebur Negative    13. Burweed Marshelder Negative    14. Ragweed, short Negative    15. Ragweed, Giant Negative    16. Plantain,  English Negative    17. Lamb's Quarters Negative    18. Sheep Sorrell Negative    19. Rough Pigweed Negative    20. Marsh Elder, Rough Negative    21. Mugwort, Common Negative    22. Ash mix Negative    23. Birch mix Negative    24.  Beech American Negative    25. Box, Elder Negative    26. Cedar, red Negative    27. Cottonwood, Guinea-Bissau Negative    28. Elm mix Negative    29. Hickory Negative    30. Maple mix Negative    31. Oak, Guinea-Bissau mix Negative    32. Pecan Pollen  Negative    33. Pine mix Negative    34. Sycamore Eastern Negative    35. Walnut, Black Pollen Negative    36. Alternaria alternata Negative    37. Cladosporium Herbarum Negative    38. Aspergillus mix Negative    39. Penicillium mix Negative    40. Bipolaris sorokiniana (Helminthosporium) Negative    41. Drechslera spicifera (Curvularia) Negative    42. Mucor plumbeus Negative    43. Fusarium moniliforme Negative    44. Aureobasidium pullulans (pullulara) Negative    45. Rhizopus oryzae Negative    46. Botrytis cinera Negative    47. Epicoccum nigrum Negative    48. Phoma betae Negative    49. Candida Albicans Negative    50. Trichophyton mentagrophytes Negative    51. Mite, D Farinae  5,000 AU/ml Negative    52. Mite, D Pteronyssinus  5,000 AU/ml Negative    53. Cat Hair 10,000 BAU/ml Negative    54.  Dog Epithelia Negative    55. Mixed Feathers Negative    56. Horse Epithelia Negative    57. Cockroach, German Negative    58. Mouse Negative    59. Tobacco Leaf Negative          Food Perc - 10/20/20 1415      Test Information   Time Antigen Placed 1415    Allergen Manufacturer Waynette Buttery    Location Back    Number of allergen test 10    Food Select      Food   1. Peanut Negative    2. Soybean food Negative    3. Wheat, whole Negative    4. Sesame Negative    5. Milk, cow Negative    6. Egg White, chicken Negative    7. Casein Negative    8. Shellfish mix Negative    9. Fish mix Negative    10. Cashew Negative          Intradermal - 10/20/20 1513    Time Antigen Placed 1500    Allergen Manufacturer Waynette Buttery    Location Arm    Number of Test 15    Intradermal Select    Control Negative    French Southern Territories Negative    Johnson Negative    7 Grass 1+    Ragweed mix Negative    Weed mix Negative    Tree mix 1+    Mold 1 1+    Mold 2 Negative    Mold 3 Negative    Mold 4 1+    Cat 1+    Dog 2+    Cockroach Negative    Mite mix Negative           Allergy  testing results were read and interpreted by myself, documented by clinical staff.         Malachi Bonds, MD [facility]

## 2021-01-31 ENCOUNTER — Other Ambulatory Visit: Payer: Self-pay

## 2021-01-31 ENCOUNTER — Ambulatory Visit (INDEPENDENT_AMBULATORY_CARE_PROVIDER_SITE_OTHER): Payer: Medicare Other | Admitting: Allergy & Immunology

## 2021-01-31 ENCOUNTER — Encounter: Payer: Self-pay | Admitting: Allergy & Immunology

## 2021-01-31 VITALS — BP 128/78 | HR 68 | Resp 16

## 2021-01-31 DIAGNOSIS — J3089 Other allergic rhinitis: Secondary | ICD-10-CM | POA: Diagnosis not present

## 2021-01-31 DIAGNOSIS — J302 Other seasonal allergic rhinitis: Secondary | ICD-10-CM

## 2021-01-31 DIAGNOSIS — R0602 Shortness of breath: Secondary | ICD-10-CM

## 2021-01-31 MED ORDER — ALBUTEROL SULFATE HFA 108 (90 BASE) MCG/ACT IN AERS: 2.0000 | INHALATION_SPRAY | RESPIRATORY_TRACT | 1 refills | Status: DC | PRN

## 2021-01-31 NOTE — Progress Notes (Signed)
FOLLOW UP  Date of Service/Encounter:  01/31/21   Assessment:   Seasonal and perennial allergic rhinitis (grasses, trees, indoor molds, outdoor molds, cat and dog)   Food intolerance - with negative testing to the most common foods   Intermittent smoker with SOB endorsed today  Plan/Recommendations:    1. Seasonal and perennial allergic rhinitis (grasses, trees, indoor molds, outdoor molds, cat and dog) - It seems that you have a good handle on your symptoms. - I would continue with what you are doing. - There is no need to use a medication every day if you do not steroid needed. - I am very happy that you are working on stopping smoking. - Stopping the smoking will help a lot with the congestion since the cigarette smoke interferes with the mucous clearance of the airways, so this will gradually get better and better as you continue to NOT smoke.   2. Shortness of breath - Lung testing looks largely normal and there was no improvement with the albuterol treatment. - I would add on albuterol 2 puffs every 4-6 hours as needed. - Let us know at the next visit if this is helping at all.  3. Return in about 3 months (around 05/03/2021).    Subjective:   Gregory Jackson is a 45 y.o. male presenting today for follow up of  Chief Complaint  Patient presents with   Allergic Rhinitis     Gregory Jackson has a history of the following: Patient Active Problem List   Diagnosis Date Noted   Elevated blood pressure 01/25/2015   Syncope 03/09/2013   Bradycardia 03/09/2013   Abnormal EKG 03/09/2013   Hypokalemia 02/29/2012   Non-compliant patient 02/27/2012   Schizophrenia, paranoid type (HCC) 12/27/2011    History obtained from: chart review and patient and brother .  Gregory Jackson is a 45 y.o. male presenting for a follow up visit.  He was last seen in April 2022 as a new patient.  At that time, he had testing that was positive to both indoor and outdoor allergens.  We started him  on Zyrtec and Flonase.  He had testing that was negative to the most common foods.  Since the last visit, he has done well. He is using the nose spray. He has been having some trouble with his allergies now. He starts acting up when it gets cold.  He is only using the nose spray around 2-3 times per week.  This seems to be controlling his symptoms.  He anticipates using it more often during the winter months.  He has not required antibiotics or steroids.  He has not smoked any today. He reports that he is now smoking around a pack per week. He bought some nicotine gum, but he has not talked to his primary doctor because he has read that it can be increased his blood pressure.  He does endorse some shortness of breath.  He thinks a lot of it might be due to obstruction due to his gum disease.  Try to get an idea of whether he is having any shortness of breath deeper in his lungs.  He has never needed an inhaler and never needed an albuterol nebulizer.   Otherwise, there have been no changes to his past medical history, surgical history, family history, or social history.    Review of Systems  Constitutional: Negative.  Negative for chills, fever, malaise/fatigue and weight loss.  HENT:  Positive for congestion. Negative for ear discharge, ear  pain and sore throat.   Eyes:  Negative for pain, discharge and redness.  Respiratory:  Positive for shortness of breath. Negative for cough, sputum production and wheezing.   Cardiovascular: Negative.  Negative for chest pain and palpitations.  Gastrointestinal:  Negative for abdominal pain, constipation, diarrhea, heartburn, nausea and vomiting.  Skin: Negative.  Negative for itching and rash.  Neurological:  Negative for dizziness and headaches.  Endo/Heme/Allergies:  Negative for environmental allergies. Does not bruise/bleed easily.      Objective:   Blood pressure 128/78, pulse 68, resp. rate 16, SpO2 96 %. There is no height or weight on file  to calculate BMI.   Physical Exam:  Physical Exam Constitutional:      Appearance: He is well-developed.  HENT:     Head: Normocephalic and atraumatic.     Right Ear: Tympanic membrane, ear canal and external ear normal.     Left Ear: Tympanic membrane, ear canal and external ear normal.     Nose: No nasal deformity, septal deviation, mucosal edema or rhinorrhea.     Right Turbinates: Not enlarged or swollen.     Left Turbinates: Not enlarged or swollen.     Right Sinus: No maxillary sinus tenderness or frontal sinus tenderness.     Left Sinus: No maxillary sinus tenderness or frontal sinus tenderness.     Mouth/Throat:     Mouth: Mucous membranes are not pale and not dry.     Pharynx: Uvula midline.  Eyes:     General: Lids are normal. No allergic shiner.       Right eye: No discharge.        Left eye: No discharge.     Conjunctiva/sclera: Conjunctivae normal.     Right eye: Right conjunctiva is not injected. No chemosis.    Left eye: Left conjunctiva is not injected. No chemosis.    Pupils: Pupils are equal, round, and reactive to light.  Cardiovascular:     Rate and Rhythm: Normal rate and regular rhythm.     Heart sounds: Normal heart sounds.  Pulmonary:     Effort: Pulmonary effort is normal. No tachypnea, accessory muscle usage or respiratory distress.     Breath sounds: Normal breath sounds. No wheezing, rhonchi or rales.     Comments: Possibly decreased air movement at the bases.  Chest:     Chest wall: No tenderness.  Lymphadenopathy:     Cervical: No cervical adenopathy.  Skin:    Coloration: Skin is not pale.     Findings: No abrasion, erythema, petechiae or rash. Rash is not papular, urticarial or vesicular.  Neurological:     Mental Status: He is alert.  Psychiatric:        Behavior: Behavior is cooperative.     Diagnostic studies:    Spirometry: results abnormal (FEV1: 2.59/64%, FVC: 3.63/71%, FEV1/FVC: 71%).    Spirometry consistent with possible  restrictive disease. Albuterol four puffs via MDI treatment given in clinic with no improvement.  Allergy Studies: none        Malachi Bonds, MD  [facility]

## 2021-01-31 NOTE — Patient Instructions (Addendum)
1. Seasonal and perennial allergic rhinitis (grasses, trees, indoor molds, outdoor molds, cat and dog) - It seems that you have a good handle on your symptoms. - I would continue with what you are doing. - There is no need to use a medication every day if you do not steroid needed. - I am very happy that you are working on stopping smoking. - Stopping the smoking will help a lot with the congestion since the cigarette smoke interferes with the mucous clearance of the airways, so this will gradually get better and better as you continue to NOT smoke.   2. Shortness of breath - Lung testing looks largely normal and there was no improvement with the albuterol treatment. - I would add on albuterol 2 puffs every 4-6 hours as needed. - Let us know at the next visit if this is helping at all.  3. Return in about 3 months (around 05/03/2021).    Please inform us of any Emergency Department visits, hospitalizations, or changes in symptoms. Call us before going to the ED for breathing or allergy symptoms since we might be able to fit you in for a sick visit. Feel free to contact us anytime with any questions, problems, or concerns.  It was a pleasure to see you again today!  Websites that have reliable patient information: 1. American Academy of Asthma, Allergy, and Immunology: www.aaaai.org 2. Food Allergy Research and Education (FARE): foodallergy.org 3. Mothers of Asthmatics: http://www.asthmacommunitynetwork.org 4. American College of Allergy, Asthma, and Immunology: www.acaai.org   COVID-19 Vaccine Information can be found at: [email or web address].[email or web address] For questions related to vaccine distribution or appointments, please email [email or web address] or call [phone].   We realize that you might be concerned about having an allergic reaction to the COVID19 vaccines. To help with that concern, WE ARE OFFERING THE COVID19 VACCINES IN OUR  OFFICE! Ask the front desk for dates!     "Like" [place] on Facebook and Instagram for our latest updates!      A healthy democracy works best when [employer] participate! Make sure you are registered to vote! If you have moved or changed any of your contact information, you will need to get this updated before voting!  In some cases, you MAY be able to register to vote online: [email or web address]

## 2021-05-04 ENCOUNTER — Other Ambulatory Visit: Payer: Self-pay

## 2021-05-04 ENCOUNTER — Encounter: Payer: Self-pay | Admitting: Allergy & Immunology

## 2021-05-04 ENCOUNTER — Ambulatory Visit (INDEPENDENT_AMBULATORY_CARE_PROVIDER_SITE_OTHER): Payer: Medicare Other | Admitting: Allergy & Immunology

## 2021-05-04 VITALS — BP 130/72 | HR 58 | Temp 98.2°F | Resp 16 | Ht 77.0 in | Wt 262.6 lb

## 2021-05-04 DIAGNOSIS — R0602 Shortness of breath: Secondary | ICD-10-CM

## 2021-05-04 DIAGNOSIS — J302 Other seasonal allergic rhinitis: Secondary | ICD-10-CM | POA: Diagnosis not present

## 2021-05-04 DIAGNOSIS — J3089 Other allergic rhinitis: Secondary | ICD-10-CM

## 2021-05-04 NOTE — Progress Notes (Signed)
FOLLOW UP  Date of Service/Encounter:  05/04/21   Assessment:   Seasonal and perennial allergic rhinitis (grasses, trees, indoor molds, outdoor molds, cat and dog)   Food intolerance - with negative testing to the most common foods   Intermittent smoker with SOB endorsed today  Plan/Recommendations:   1. Seasonal and perennial allergic rhinitis (grasses, trees, indoor molds, outdoor molds, cat and dog) - It seems that you have a good handle on your symptoms. - I would continue with what you are doing. - Continue to work on giving up those cigarettes!   2. Shortness of breath - Lung testing looks slightly better than last time.  - Continue with albuterol 2 puffs every 4-6 hours as needed.  3. Return in about 6 months (around 11/02/2021).   Subjective:   Gregory Jackson is a 45 y.o. male presenting today for follow up of  Chief Complaint  Patient presents with   Follow-up    Patient in today for a follow up. He states the inhaler is working and helping a lot.    [name] has a history of the following: Patient Active Problem List   Diagnosis Date Noted   Elevated blood pressure 01/25/2015   Syncope 03/09/2013   Bradycardia 03/09/2013   Abnormal EKG 03/09/2013   Hypokalemia 02/29/2012   Non-compliant patient 02/27/2012   Schizophrenia, paranoid type (HCC) 12/27/2011    History obtained from: chart review and patient and brother.  Gregory Jackson is a 45 y.o. male presenting for a follow up visit.  He was just seen in July 2022.  At that time his symptoms were under good control.  We recommended using the medications as needed since his symptoms seem to be under good control.  For his shortness of breath, lung testing was normal.  We added albuterol 2 puffs every 4-6 hours.  Since last visit, he has done very well.  Asthma/Respiratory Symptom History: As at the previous visit, it is somewhat difficult to get an idea of how frequently he is needed to use his albuterol.   He finally settles on 2-3 times per week.  He does help when he uses it.  He denies any nighttime coughing or wheezing.  He has no nighttime awakenings for his asthma.  He has not been to the emergency room nor has he needed systemic steroids.  Allergic Rhinitis Symptom History: He is not using any of his medications on a routine basis.  Winter is the worst time of the year for him, so he anticipates starting everything.  He has cetirizine as well as fluticasone to use as needed.   He has never been diagnosed with COPD.  Has never needed any inhalers or nebulizers.  He is currently on disability for schizophrenia.  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  He then disappeared to [place] somewhere in there and was admitted to a psychiatric unit up there.  He has now stabilized and living with his father.  Otherwise, there have been no changes to his past medical history, surgical history, family history, or social history.    Review of Systems  Constitutional: Negative.  Negative for chills, fever, malaise/fatigue and weight loss.  HENT: Negative.  Negative for congestion, ear discharge, ear pain and sinus pain.   Eyes:  Negative for pain, discharge and redness.  Respiratory:  Negative for cough, sputum production, shortness of breath and wheezing.   Cardiovascular:  Negative.  Negative for chest pain and palpitations.  Gastrointestinal:  Negative for abdominal pain, constipation, diarrhea, heartburn, nausea and vomiting.  Skin: Negative.  Negative for itching and rash.  Neurological:  Negative for dizziness and headaches.  Endo/Heme/Allergies:  Negative for environmental allergies. Does not bruise/bleed easily.      Objective:   Blood pressure 130/72, pulse (!) 58, temperature 98.2 F (36.8 C), temperature source Temporal, resp. rate 16, height 6\' 5"  (1.956 m), weight 262 lb 9.6 oz (119.1 kg), SpO2 96 %. Body mass index is  31.14 kg/m.   Physical Exam:  Physical Exam Vitals reviewed.  Constitutional:      Appearance: He is well-developed.  HENT:     Head: Normocephalic and atraumatic.     Right Ear: Tympanic membrane, ear canal and external ear normal.     Left Ear: Tympanic membrane, ear canal and external ear normal.     Nose: No nasal deformity, septal deviation, mucosal edema or rhinorrhea.     Right Turbinates: Enlarged and swollen.     Left Turbinates: Enlarged and swollen.     Right Sinus: No maxillary sinus tenderness or frontal sinus tenderness.     Left Sinus: No maxillary sinus tenderness or frontal sinus tenderness.     Mouth/Throat:     Lips: Pink.     Mouth: Mucous membranes are moist. Mucous membranes are not pale and not dry.     Pharynx: Uvula midline.  Eyes:     General: Lids are normal. No allergic shiner.       Right eye: No discharge.        Left eye: No discharge.     Conjunctiva/sclera: Conjunctivae normal.     Right eye: Right conjunctiva is not injected. No chemosis.    Left eye: Left conjunctiva is not injected. No chemosis.    Pupils: Pupils are equal, round, and reactive to light.  Cardiovascular:     Rate and Rhythm: Normal rate and regular rhythm.     Heart sounds: Normal heart sounds.  Pulmonary:     Effort: Pulmonary effort is normal. No tachypnea, accessory muscle usage or respiratory distress.     Breath sounds: Decreased air movement present. Rhonchi present. No wheezing or rales.     Comments: Difficult to hear sounds at the bases.  He does have some rhonchi throughout. Chest:     Chest wall: No tenderness.  Lymphadenopathy:     Cervical: No cervical adenopathy.  Skin:    General: Skin is warm.     Capillary Refill: Capillary refill takes less than 2 seconds.     Coloration: Skin is not pale.     Findings: No abrasion, erythema, petechiae or rash. Rash is not papular, urticarial or vesicular.     Comments: Very dry ichthyotic hands bilaterally.   Neurological:     Mental Status: He is alert.  Psychiatric:        Behavior: Behavior is cooperative.     Diagnostic studies:    Spirometry: results abnormal (FEV1: 2.69/54%, FVC: 3.74/58%, FEV1/FVC: 72%).    Spirometry consistent with possible restrictive disease.  Values are slightly higher than those obtained at the last visit.  Allergy Studies: none        , MD  [facility]

## 2021-05-04 NOTE — Addendum Note (Signed)
Addended by: Areta Haber B on: 05/04/2021 04:55 PM   Modules accepted: Orders

## 2021-05-04 NOTE — Patient Instructions (Addendum)
1. Seasonal and perennial allergic rhinitis (grasses, trees, indoor molds, outdoor molds, cat and dog) - It seems that you have a good handle on your symptoms. - I would continue with what you are doing. - Continue to work on giving up those cigarettes!   2. Shortness of breath - Lung testing looks slightly better than last time.  - Continue with albuterol 2 puffs every 4-6 hours as needed.  3. Return in about 6 months (around 11/02/2021).    Please inform us of any Emergency Department visits, hospitalizations, or changes in symptoms. Call us before going to the ED for breathing or allergy symptoms since we might be able to fit you in for a sick visit. Feel free to contact us anytime with any questions, problems, or concerns.  It was a pleasure to see you again today!  Websites that have reliable patient information: 1. American Academy of Asthma, Allergy, and Immunology: www.aaaai.org 2. Food Allergy Research and Education (FARE): foodallergy.org 3. Mothers of Asthmatics: http://www.asthmacommunitynetwork.org 4. American College of Allergy, Asthma, and Immunology: www.acaai.org   COVID-19 Vaccine Information can be found at: [email or web address] For questions related to vaccine distribution or appointments, please email [email or web address] or call [phone].   We realize that you might be concerned about having an allergic reaction to the COVID19 vaccines. To help with that concern, WE ARE OFFERING THE COVID19 VACCINES IN OUR OFFICE! Ask the front desk for dates!     "Like" [place] on Facebook and Instagram for our latest updates!      A healthy democracy works best when [employer] participate! Make sure you are registered to vote! If you have moved or changed any of your contact information, you will need to get this updated before voting!  In some cases, you MAY be able to register to vote online:  [email or web address]

## 2021-11-02 ENCOUNTER — Ambulatory Visit: Payer: Medicare Other | Admitting: Allergy & Immunology

## 2021-11-16 ENCOUNTER — Other Ambulatory Visit: Payer: Self-pay

## 2021-11-16 ENCOUNTER — Encounter: Payer: Self-pay | Admitting: Allergy & Immunology

## 2021-11-16 ENCOUNTER — Ambulatory Visit (INDEPENDENT_AMBULATORY_CARE_PROVIDER_SITE_OTHER): Payer: Medicare Other | Admitting: Allergy & Immunology

## 2021-11-16 VITALS — BP 122/76 | HR 53 | Temp 98.3°F | Resp 20 | Wt 273.6 lb

## 2021-11-16 DIAGNOSIS — F17200 Nicotine dependence, unspecified, uncomplicated: Secondary | ICD-10-CM

## 2021-11-16 DIAGNOSIS — J4540 Moderate persistent asthma, uncomplicated: Secondary | ICD-10-CM

## 2021-11-16 DIAGNOSIS — J302 Other seasonal allergic rhinitis: Secondary | ICD-10-CM | POA: Diagnosis not present

## 2021-11-16 DIAGNOSIS — J3089 Other allergic rhinitis: Secondary | ICD-10-CM | POA: Diagnosis not present

## 2021-11-16 DIAGNOSIS — K9049 Malabsorption due to intolerance, not elsewhere classified: Secondary | ICD-10-CM

## 2021-11-16 DIAGNOSIS — R0602 Shortness of breath: Secondary | ICD-10-CM

## 2021-11-16 MED ORDER — TRELEGY ELLIPTA 200-62.5-25 MCG/ACT IN AEPB: 1.0000 | INHALATION_SPRAY | Freq: Every day | RESPIRATORY_TRACT | 5 refills | Status: AC

## 2021-11-16 MED ORDER — FLUTICASONE PROPIONATE 50 MCG/ACT NA SUSP: NASAL | 5 refills | Status: DC

## 2021-11-16 MED ORDER — CETIRIZINE HCL 10 MG PO TABS: ORAL_TABLET | ORAL | 5 refills | Status: DC

## 2021-11-16 MED ORDER — TRELEGY ELLIPTA 200-62.5-25 MCG/ACT IN AEPB: 1.0000 | INHALATION_SPRAY | Freq: Every day | RESPIRATORY_TRACT | 5 refills | Status: DC

## 2021-11-16 MED ORDER — ALBUTEROL SULFATE HFA 108 (90 BASE) MCG/ACT IN AERS: 2.0000 | INHALATION_SPRAY | RESPIRATORY_TRACT | 1 refills | Status: DC | PRN

## 2021-11-16 NOTE — Patient Instructions (Addendum)
1. Seasonal and perennial allergic rhinitis (grasses, trees, indoor molds, outdoor molds, cat and dog) ?- Start Flonase one spray per nostril twice daily to see if this will help with your nasal drainage. ?- Start Zyrtec 10 mg daily for allergy control. ?- Follow up with the dentist for that tooth pain. ? ?2. Shortness of breath ?- Lung testing deferred today. ?- We are going to start Trelegy to make things easier.  ?- Daily controller medication(s): Trelegy 200/62.5/25 one puff once daily ?- Prior to physical activity: albuterol 2 puffs 10-15 minutes before physical activity. ?- Rescue medications: albuterol 4 puffs every 4-6 hours as needed ?- Asthma control goals:  ?* Full participation in all desired activities (may need albuterol before activity) ?* Albuterol use two time or less a week on average (not counting use with activity) ?* Cough interfering with sleep two time or less a month ?* Oral steroids no more than once a year ?* No hospitalizations ? ?3. Return in about 6 months (around 05/19/2022).  ? ? ?Please inform us of any Emergency Department visits, hospitalizations, or changes in symptoms. Call us before going to the ED for breathing or allergy symptoms since we might be able to fit you in for a sick visit. Feel free to contact us anytime with any questions, problems, or concerns. ? ?It was a pleasure to see you again today! ? ?Websites that have reliable patient information: ?1. American Academy of Asthma, Allergy, and Immunology: www.aaaai.org ?2. Food Allergy Research and Education (FARE): foodallergy.org ?3. Mothers of Asthmatics: http://www.asthmacommunitynetwork.org ?4. [employer] of Allergy, Asthma, and Immunology: MissingWeapons.ca ? ? ?COVID-19 Vaccine Information can be found at: [email or web address] For questions related to vaccine distribution or appointments, please email [email or web address] or call [phone].  ? ?We realize  that you might be concerned about having an allergic reaction to the COVID19 vaccines. To help with that concern, WE ARE OFFERING THE COVID19 VACCINES IN OUR OFFICE! Ask the front desk for dates!  ? ? ? ??Like? [place] on Facebook and Instagram for our latest updates!  ?  ? ? ?A healthy democracy works best when [employer] participate! Make sure you are registered to vote! If you have moved or changed any of your contact information, you will need to get this updated before voting! ? ?In some cases, you MAY be able to register to vote online: [email or web address] ? ? ? ? ? ?You can buy saline nose drops at a pharmacy, or you can make your own saline solution:  ?1. Add 1 cup (240 mL) distilled water to a clean container. If you use tap water, boil it first to sterilize it, and then let it cool until it is lukewarm.  ?2. Add 0.5 tsp (2.5 g) salt to the water. ?3. Add 0.5 tsp (2.5 g) baking soda. ? ? ? ? ?

## 2021-11-16 NOTE — Progress Notes (Signed)
? ?FOLLOW UP ? ?Date of Service/Encounter:  11/16/21 ? ? ?Assessment:  ? ?Seasonal and perennial allergic rhinitis (grasses, trees, indoor molds, outdoor molds, cat and dog) ? ?Moderate persistent asthma, not well controlled ?  ?Food intolerance - with negative testing to the most common foods ?  ?Intermittent smoker with SOB endorsed today ? ?Plan/Recommendations:  ? ?1. Seasonal and perennial allergic rhinitis (grasses, trees, indoor molds, outdoor molds, cat and dog) ?- Start Flonase one spray per nostril twice daily to see if this will help with your nasal drainage. ?- Start Zyrtec 10 mg daily for allergy control. ?- Follow up with the dentist for that tooth pain. ? ?2. Shortness of breath ?- Lung testing deferred today. ?- We are going to start Trelegy to make things easier.  ?- Daily controller medication(s): Trelegy 200/62.5/25 one puff once daily ?- Prior to physical activity: albuterol 2 puffs 10-15 minutes before physical activity. ?- Rescue medications: albuterol 4 puffs every 4-6 hours as needed ?- Asthma control goals:  ?* Full participation in all desired activities (may need albuterol before activity) ?* Albuterol use two time or less a week on average (not counting use with activity) ?* Cough interfering with sleep two time or less a month ?* Oral steroids no more than once a year ?* No hospitalizations ? ?3. Return in about 6 months (around 05/19/2022).  ? ?Subjective:  ? ?Gregory Jackson is a 46 y.o. male presenting today for follow up of  ?Chief Complaint  ?Patient presents with  ? Asthma  ?  No issues   ? Other  ?  Has been having pain due to his tooth   ? ? ?Gregory Jackson has a history of the following: ?Patient Active Problem List  ? Diagnosis Date Noted  ? Elevated blood pressure 01/25/2015  ? Syncope 03/09/2013  ? Bradycardia 03/09/2013  ? Abnormal EKG 03/09/2013  ? Hypokalemia 02/29/2012  ? Non-compliant patient 02/27/2012  ? Schizophrenia, paranoid type (HCC) 12/27/2011  ? ? ?History  obtained from: chart review and patient and brother. ? ?Gregory Jackson is a 46 y.o. male presenting for a follow up visit.  He was last seen in October 2022.  At that time, he seemed to have a good handle on his symptoms.  For his shortness of breath, his lung testing looks slightly better.  We continue with albuterol as needed.  We did not feel like a controller medication was indicated at that time. ? ?Since last visit, he has done well.  Most of the history is obtained from his brother, who accompanies him today. ? ?Asthma/Respiratory Symptom History: He is using albuterol twice daily. It is unclear whether this helps when he uses it. He has never been on a controller medication. He is interested in doing Trelegy. Specifically, his brother is interested in trying Trelegy for him.  The rest of the family is on it, so his brother thinks it might be easier if Lang were on it as well. He smokes a pack per day and then takes a day or two off and then smokes another pack per day.  ? ?Allergic Rhinitis Symptom History: He does report a lot of congestion.  He also endorses some tooth pain which has been ongoing for 2 or 3 weeks.  He does have a dental visit scheduled for next week.  He has not had a fever. ? ?Otherwise, there have been no changes to his past medical history, surgical history, family history, or social history. ? ? ? ?  Review of Systems  ?Constitutional: Negative.  Negative for chills, fever, malaise/fatigue and weight loss.  ?HENT:  Positive for congestion. Negative for ear discharge, ear pain and sinus pain.   ?Eyes:  Negative for pain, discharge and redness.  ?Respiratory:  Positive for cough and shortness of breath. Negative for sputum production and wheezing.   ?Cardiovascular: Negative.  Negative for chest pain and palpitations.  ?Gastrointestinal:  Negative for abdominal pain, constipation, diarrhea, heartburn, nausea and vomiting.  ?Skin: Negative.  Negative for itching and rash.  ?Neurological:  Negative  for dizziness and headaches.  ?Endo/Heme/Allergies:  Negative for environmental allergies. Does not bruise/bleed easily.   ? ? ? ?Objective:  ? ?Blood pressure 122/76, pulse (!) 53, temperature 98.3 ?F (36.8 ?C), resp. rate 20, weight 273 lb 9.6 oz (124.1 kg), SpO2 96 %. ?Body mass index is 32.44 kg/m?. ? ? ? ?Physical Exam ?Vitals reviewed.  ?Constitutional:   ?   Appearance: He is well-developed.  ?HENT:  ?   Head: Normocephalic and atraumatic.  ?   Right Ear: Tympanic membrane, ear canal and external ear normal.  ?   Left Ear: Tympanic membrane, ear canal and external ear normal.  ?   Nose: No nasal deformity, septal deviation, mucosal edema or rhinorrhea.  ?   Right Turbinates: Enlarged, swollen and pale.  ?   Left Turbinates: Enlarged, swollen and pale.  ?   Right Sinus: No maxillary sinus tenderness or frontal sinus tenderness.  ?   Left Sinus: No maxillary sinus tenderness or frontal sinus tenderness.  ?   Mouth/Throat:  ?   Lips: Pink.  ?   Mouth: Mucous membranes are moist. Mucous membranes are not pale and not dry.  ?   Pharynx: Uvula midline.  ?Eyes:  ?   General: Lids are normal. No allergic shiner.    ?   Right eye: No discharge.     ?   Left eye: No discharge.  ?   Conjunctiva/sclera: Conjunctivae normal.  ?   Right eye: Right conjunctiva is not injected. No chemosis. ?   Left eye: Left conjunctiva is not injected. No chemosis. ?   Pupils: Pupils are equal, round, and reactive to light.  ?Cardiovascular:  ?   Rate and Rhythm: Normal rate and regular rhythm.  ?   Heart sounds: Normal heart sounds.  ?Pulmonary:  ?   Effort: Pulmonary effort is normal. No tachypnea, accessory muscle usage or respiratory distress.  ?   Breath sounds: Decreased air movement present. Rhonchi present. No wheezing or rales.  ?   Comments: Difficult to hear sounds at the bases.  He does have some rhonchi throughout. ?Chest:  ?   Chest wall: No tenderness.  ?Lymphadenopathy:  ?   Cervical: No cervical adenopathy.  ?Skin: ?    General: Skin is warm.  ?   Capillary Refill: Capillary refill takes less than 2 seconds.  ?   Coloration: Skin is not pale.  ?   Findings: No abrasion, erythema, petechiae or rash. Rash is not papular, urticarial or vesicular.  ?   Comments: Very dry ichthyotic hands bilaterally.  ?Neurological:  ?   Mental Status: He is alert.  ?Psychiatric:     ?   Behavior: Behavior is cooperative.  ?  ? ?Diagnostic studies: none ? ? ? ?  ?Malachi Bonds, MD  ?[facility] ? ? ? ? ? ? ?

## 2022-05-29 ENCOUNTER — Encounter: Payer: Self-pay | Admitting: Allergy & Immunology

## 2022-05-29 ENCOUNTER — Ambulatory Visit (INDEPENDENT_AMBULATORY_CARE_PROVIDER_SITE_OTHER): Payer: Medicare Other | Admitting: Allergy & Immunology

## 2022-05-29 VITALS — BP 130/92 | HR 58 | Temp 98.2°F | Resp 16 | Ht 77.0 in | Wt 251.6 lb

## 2022-05-29 DIAGNOSIS — J4540 Moderate persistent asthma, uncomplicated: Secondary | ICD-10-CM | POA: Diagnosis not present

## 2022-05-29 DIAGNOSIS — J3089 Other allergic rhinitis: Secondary | ICD-10-CM

## 2022-05-29 DIAGNOSIS — J302 Other seasonal allergic rhinitis: Secondary | ICD-10-CM

## 2022-05-29 NOTE — Patient Instructions (Addendum)
1. Seasonal and perennial allergic rhinitis (grasses, trees, indoor molds, outdoor molds, cat and dog) - Continue Flonase one spray per nostril twice daily to see if this will help with your nasal drainage. - Continue Zyrtec 10 mg daily for allergy control.  2. Shortness of breath - Lung testing is largely stable today.  - It seems that Trelegy is helping you a lot. - We are not going to make any medication changes at this time.  - Daily controller medication(s): Trelegy 200/62.5/25 one puff once daily - Prior to physical activity: albuterol 2 puffs 10-15 minutes before physical activity. - Rescue medications: albuterol 4 puffs every 4-6 hours as needed - Asthma control goals:  * Full participation in all desired activities (may need albuterol before activity) * Albuterol use two time or less a week on average (not counting use with activity) * Cough interfering with sleep two time or less a month * Oral steroids no more than once a year * No hospitalizations  3. Return in about 6 months (around 11/27/2022).    Please inform us of any Emergency Department visits, hospitalizations, or changes in symptoms. Call us before going to the ED for breathing or allergy symptoms since we might be able to fit you in for a sick visit. Feel free to contact us anytime with any questions, problems, or concerns.  It was a pleasure to see you again today!  Websites that have reliable patient information: 1. American Academy of Asthma, Allergy, and Immunology: www.aaaai.org 2. Food Allergy Research and Education (FARE): foodallergy.org 3. Mothers of Asthmatics: http://www.asthmacommunitynetwork.org 4. American College of Allergy, Asthma, and Immunology: www.acaai.org   COVID-19 Vaccine Information can be found at: [email or web address].[email or web address] For questions related to vaccine distribution or appointments, please email [email or web address] or call  [phone].   We realize that you might be concerned about having an allergic reaction to the COVID19 vaccines. To help with that concern, WE ARE OFFERING THE COVID19 VACCINES IN OUR OFFICE! Ask the front desk for dates!     "Like" [place] on Facebook and Instagram for our latest updates!      A healthy democracy works best when [employer] participate! Make sure you are registered to vote! If you have moved or changed any of your contact information, you will need to get this updated before voting!  In some cases, you MAY be able to register to vote online: [email or web address]

## 2022-05-29 NOTE — Progress Notes (Signed)
FOLLOW UP  Date of Service/Encounter:  05/29/22   Assessment:   Seasonal and perennial allergic rhinitis (grasses, trees, indoor molds, outdoor molds, cat and dog)   Moderate persistent asthma, not well controlled   Food intolerance - with negative testing to the most common foods   Intermittent smoker with SOB endorsed today  Plan/Recommendations:   1. Seasonal and perennial allergic rhinitis (grasses, trees, indoor molds, outdoor molds, cat and dog) - Continue Flonase one spray per nostril twice daily to see if this will help with your nasal drainage. - Continue Zyrtec 10 mg daily for allergy control.  2. Shortness of breath - Lung testing is largely stable today.  - It seems that Trelegy is helping you a lot. - We are not going to make any medication changes at this time.  - Daily controller medication(s): Trelegy 200/62.5/25 one puff once daily - Prior to physical activity: albuterol 2 puffs 10-15 minutes before physical activity. - Rescue medications: albuterol 4 puffs every 4-6 hours as needed - Asthma control goals:  * Full participation in all desired activities (may need albuterol before activity) * Albuterol use two time or less a week on average (not counting use with activity) * Cough interfering with sleep two time or less a month * Oral steroids no more than once a year * No hospitalizations  3. Return in about 6 months (around 11/27/2022).    Subjective:   Gregory Jackson is a 46 y.o. male presenting today for follow up of  Chief Complaint  Patient presents with   Asthma    6 mth f/u - Alright   Seasonal and Perennial Allergic Rhinitis    6 mth f/u - Alright    NYRELL KNEELAND has a history of the following: Patient Active Problem List   Diagnosis Date Noted   Elevated blood pressure 01/25/2015   Syncope 03/09/2013   Bradycardia 03/09/2013   Abnormal EKG 03/09/2013   Hypokalemia 02/29/2012   Non-compliant patient 02/27/2012   Schizophrenia,  paranoid type (HCC) 12/27/2011    History obtained from: chart review and patient and father.  Gregory Jackson is a 46 y.o. male presenting for a follow up visit. He was last seen May 2023. At that time, we started fluticasone and cetirizine. For his SOB, we started Trelegy to make his regimen easier.   Since last visit, he has largely done well.  He has more talkative today than he has been in the past.  Asthma/Respiratory Symptom History: He has been using his Trelegy one puff once daily. He has not needed to go to the hospital at all.  Adis's asthma has been well controlled. He has not required rescue medication, experienced nocturnal awakenings due to lower respiratory symptoms, nor have activities of daily living been limited. He has required no Emergency Department or Urgent Care visits for his asthma. He has required zero courses of systemic steroids for asthma exacerbations since the last visit. ACT score today is 25, indicating excellent asthma symptom control.   Allergic Rhinitis Symptom History: He remains on the cetirizine and Flonase.  He does have a cold right now so is having some postnasal drip and a productive cough, most of the time he is perfectly fine.  Otherwise, there have been no changes to his past medical history, surgical history, family history, or social history.    Review of Systems  Constitutional: Negative.  Negative for fever, malaise/fatigue and weight loss.  HENT: Negative.  Negative for congestion, ear discharge and  ear pain.   Eyes:  Negative for pain, discharge and redness.  Respiratory:  Positive for cough and sputum production. Negative for shortness of breath and wheezing.   Cardiovascular: Negative.  Negative for chest pain and palpitations.  Gastrointestinal:  Negative for abdominal pain, constipation, diarrhea, heartburn, nausea and vomiting.  Skin: Negative.  Negative for itching and rash.  Neurological:  Negative for dizziness and headaches.   Endo/Heme/Allergies:  Positive for environmental allergies. Does not bruise/bleed easily.       Objective:   Blood pressure (!) 130/92, pulse (!) 58, temperature 98.2 F (36.8 C), resp. rate 16, height 6\' 5"  (1.956 m), weight 251 lb 9.6 oz (114.1 kg), SpO2 95 %. Body mass index is 29.84 kg/m.    Physical Exam Vitals reviewed.  Constitutional:      Appearance: He is well-developed.  HENT:     Head: Normocephalic and atraumatic.     Right Ear: Tympanic membrane, ear canal and external ear normal.     Left Ear: Tympanic membrane, ear canal and external ear normal.     Nose: No nasal deformity, septal deviation, mucosal edema or rhinorrhea.     Right Turbinates: Enlarged, swollen and pale.     Left Turbinates: Enlarged, swollen and pale.     Right Sinus: No maxillary sinus tenderness or frontal sinus tenderness.     Left Sinus: No maxillary sinus tenderness or frontal sinus tenderness.     Comments: No nasal polyps noted.     Mouth/Throat:     Lips: Pink.     Mouth: Mucous membranes are moist. Mucous membranes are not pale and not dry.     Pharynx: Uvula midline.  Eyes:     General: Lids are normal. No allergic shiner.       Right eye: No discharge.        Left eye: No discharge.     Conjunctiva/sclera: Conjunctivae normal.     Right eye: Right conjunctiva is not injected. No chemosis.    Left eye: Left conjunctiva is not injected. No chemosis.    Pupils: Pupils are equal, round, and reactive to light.  Cardiovascular:     Rate and Rhythm: Normal rate and regular rhythm.     Heart sounds: Normal heart sounds.  Pulmonary:     Effort: Pulmonary effort is normal. No tachypnea, accessory muscle usage or respiratory distress.     Breath sounds: Decreased air movement present. Rhonchi present. No wheezing or rales.     Comments: Difficult to hear sounds at the bases.  He does have some rhonchi throughout. Chest:     Chest wall: No tenderness.  Lymphadenopathy:     Cervical:  No cervical adenopathy.  Skin:    General: Skin is warm.     Capillary Refill: Capillary refill takes less than 2 seconds.     Coloration: Skin is not pale.     Findings: No abrasion, erythema, petechiae or rash. Rash is not papular, urticarial or vesicular.     Comments: Very dry ichthyotic hands bilaterally.  Neurological:     Mental Status: He is alert.  Psychiatric:        Behavior: Behavior is cooperative.      Diagnostic studies:    Spirometry: results abnormal (FEV1: 2.53/64%, FVC: 3.74/74%, FEV1/FVC: 68%).    Spirometry consistent with mixed obstructive and restrictive disease.  Overall, values are stable.  Allergy Studies: none        Malachi Bonds, MD  [facility]

## 2022-05-30 MED ORDER — FLUTICASONE PROPIONATE 50 MCG/ACT NA SUSP: NASAL | 5 refills | Status: AC

## 2022-05-30 MED ORDER — TRELEGY ELLIPTA 200-62.5-25 MCG/ACT IN AEPB: 1.0000 | INHALATION_SPRAY | Freq: Every day | RESPIRATORY_TRACT | 5 refills | Status: AC

## 2022-05-30 MED ORDER — CETIRIZINE HCL 10 MG PO TABS: ORAL_TABLET | ORAL | 5 refills | Status: AC

## 2022-09-07 ENCOUNTER — Emergency Department (HOSPITAL_COMMUNITY)
Admission: EM | Admit: 2022-09-07 | Discharge: 2022-09-07 | Disposition: A | Discharge status: Home / Self Care | Payer: 59 | Attending: Emergency Medicine | Admitting: Emergency Medicine

## 2022-09-07 ENCOUNTER — Encounter (HOSPITAL_COMMUNITY): Payer: Self-pay

## 2022-09-07 ENCOUNTER — Other Ambulatory Visit: Payer: Self-pay

## 2022-09-07 DIAGNOSIS — Z760 Encounter for issue of repeat prescription: Secondary | ICD-10-CM | POA: Diagnosis not present

## 2022-09-07 DIAGNOSIS — F209 Schizophrenia, unspecified: Secondary | ICD-10-CM

## 2022-09-07 MED ORDER — PALIPERIDONE ER 6 MG PO TB24
6.0000 mg | ORAL_TABLET | Freq: Once | ORAL | Status: AC
  Administered 2022-09-07: 6 mg via ORAL
  Filled 2022-09-07: qty 1

## 2022-09-07 MED ORDER — BENZTROPINE MESYLATE 1 MG PO TABS: 1.0000 mg | ORAL_TABLET | Freq: Two times a day (BID) | ORAL | 1 refills | Status: DC

## 2022-09-07 MED ORDER — PALIPERIDONE ER 6 MG PO TB24: 6.0000 mg | ORAL_TABLET | Freq: Every day | ORAL | 1 refills | Status: DC

## 2022-09-07 NOTE — Discharge Instructions (Addendum)
Please follow-up with her psychiatrist, return emergency department if you have any concerns for medication side effects or audio, visual hallucinations, thoughts of hurting yourself or others.

## 2022-09-07 NOTE — ED Provider Notes (Signed)
[facility] Provider Note   CSN: [id number] Arrival date & time: 09/07/22  1747     History  Chief Complaint  Patient presents with   Medical Clearance    Gregory Jackson is a 47 y.o. male with past medical history significant for schizophrenia who presents with concern for schizophrenia exacerbation, found to be miles from home on foot, he was prolonged course of attempting interlabial symptoms and buildings.  Patient reports it has been over a week since he had any his behavioral health meds.  He was previously receiving [place] injections, but has been taking paliperidone extended release 6 mg for a while, he has not had any treatment in over a week.  At time of my evaluation he is status post receiving 5 mg Haldol, and he is denying any AVH, SI, HI, he is calm, cooperative, and resting in bed.  His brother who he lives with is present at my evaluation and reports that he is comfortable taking the patient home, he lives with the patient, and requesting refill of his medication, I think that this is a very reasonable plan, will discharge with refills of his benztropine, and Invega  HPI     Home Medications Prior to Admission medications   Medication Sig Start Date End Date Taking? Authorizing Provider  benztropine (COGENTIN) 1 MG tablet Take 1 tablet (1 mg total) by mouth 2 (two) times daily. 09/07/22  Yes Gust Eugene H, PA-C  paliperidone (INVEGA) 6 MG 24 hr tablet Take 1 tablet (6 mg total) by mouth daily. 09/07/22  Yes Eusebio Blazejewski H, PA-C  albuterol (VENTOLIN HFA) 108 (90 Base) MCG/ACT inhaler Inhale 2 puffs into the lungs every 4 (four) hours as needed for wheezing or shortness of breath. 11/16/21   Valentina Shaggy, MD  cetirizine (ZYRTEC) 10 MG tablet Take 1 tablet once a day or as needed for runny nose or itchy areas 05/30/22   Valentina Shaggy, MD  fluticasone [facility]) 50 MCG/ACT nasal spray One spray  once a day or as needed for stuffy nose 05/30/22   Valentina Shaggy, MD  INVEGA SUSTENNA 156 MG/ML SUSY injection INJECT ONE syringe Intramuscularly ONCE EVERY MONTH 11/11/17   [provider]  INVEGA TRINZA 546 MG/1.75ML SUSY INJECT ONE syringe Intramuscularly EVERY 3 MONTHS 11/22/17   [provider]  propranolol (INDERAL) 10 MG tablet Take 10 mg by mouth 2 (two) times daily. 05/19/20   [provider]      Allergies    Chocolate and Milk-related compounds    Review of Systems   Review of Systems  All other systems reviewed and are negative.   Physical Exam Updated Vital Signs BP (!) 141/84 (BP Location: Right Arm)   Pulse 84   Temp 98.6 F (37 C) (Oral)   Resp 16   SpO2 100%  Physical Exam Vitals and nursing note reviewed.  Constitutional:      General: He is not in acute distress.    Appearance: Normal appearance.     Comments: Resting comfortably in bed, no acute distress  HENT:     Head: Normocephalic and atraumatic.  Eyes:     General:        Right eye: No discharge.        Left eye: No discharge.  Cardiovascular:     Rate and Rhythm: Normal rate and regular rhythm.  Pulmonary:     Effort: Pulmonary effort is normal. No respiratory  distress.  Musculoskeletal:        General: No deformity.  Skin:    General: Skin is warm and dry.  Neurological:     Mental Status: He is alert and oriented to person, place, and time.  Psychiatric:        Mood and Affect: Mood normal.        Behavior: Behavior normal.     ED Results / Procedures / Treatments   Labs (all labs ordered are listed, but only abnormal results are displayed) Labs Reviewed - No data to display  EKG None  Radiology No results found.  Procedures Procedures    Medications Ordered in ED Medications  paliperidone (INVEGA) 24 hr tablet 6 mg (has no administration in time range)    ED Course/ Medical Decision Making/ A&P                             Medical  Decision Making  Patient is a 47 y.o. male  who presents to the emergency department for psychiatric complaint.  Past Medical History: Schizophrenia  Physical Exam: Patient resting in bed comfortably, answers questions appropriately, he denies any AVH, SI, HI, he is cooperative and voluntary at this time.  Labs: Considered medical screening labs for this patient, however I do not think that he would require any additional workup as I do not have any questions or need for evaluation by psychiatry today, he seems appropriate after Haldol, and his brother reports that his exacerbation of symptoms is secondary to not taking his home medication.  Think is reasonable to discharge with a refill of his home medication, and plan for close psychiatric follow-up.  Medications: Since his home pharmacy is closed until Monday I think reasonable to give him extended release Invega tablet prior to discharge, discussed with brother that there is no way for me to give him medication to administer tomorrow without sending into a different pharmacy, but as it is evening at this time I think that he is likely to be stable until picking up his medications on Monday, but encouraged follow-up for repeat evaluation if any concerns tomorrow evening.  Disposition: Patient is otherwise medically cleared at this time pending medical clearance laboratory evaluation. Will consult TTS and appreciate their recommendations.  I discussed this case with my attending physician Dr. Maryan Rued who cosigned this note including patient's presenting symptoms, physical exam, and planned diagnostics and interventions. Attending physician stated agreement with plan or made changes to plan which were implemented.   Final Clinical Impression(s) / ED Diagnoses Final diagnoses:  Schizophrenia, unspecified type ([place])  Medication refill    Rx / DC Orders ED Discharge Orders          Ordered    paliperidone (INVEGA) 6 MG 24 hr tablet   Daily        09/07/22 1810    benztropine (COGENTIN) 1 MG tablet  2 times daily        09/07/22 1810              Hartman Minahan, Jackalyn Lombard 09/07/22 Achille Rich, MD 09/08/22 [phone]

## 2022-09-07 NOTE — ED Triage Notes (Addendum)
Pt via EMS, hx schizophrenia, found 25 miles from home on foot. He was per law enforcement attempting to enter people's homes and [place]. Pt states it has been over a week since he had his BH meds and he needs refills of his prescriptions. Received '5mg'$  haldol to [facility] @ [facility] by EMS. Currently resting at time of arrival. Pt is voluntary at this time.   BP 140/79 HR 72 CBG 91 T 97.8  Allergic to milk

## 2022-11-18 ENCOUNTER — Telehealth (HOSPITAL_COMMUNITY): Payer: Self-pay | Admitting: Psychiatry

## 2022-11-18 ENCOUNTER — Ambulatory Visit (HOSPITAL_BASED_OUTPATIENT_CLINIC_OR_DEPARTMENT_OTHER): Payer: 59 | Admitting: Psychiatry

## 2022-11-18 ENCOUNTER — Encounter (HOSPITAL_COMMUNITY): Payer: Self-pay | Admitting: Psychiatry

## 2022-11-18 DIAGNOSIS — F200 Paranoid schizophrenia: Secondary | ICD-10-CM | POA: Diagnosis not present

## 2022-11-18 MED ORDER — PROPRANOLOL HCL 10 MG PO TABS
10.0000 mg | ORAL_TABLET | Freq: Two times a day (BID) | ORAL | 1 refills | Status: DC
Start: 2022-11-18 — End: 2022-12-18

## 2022-11-18 MED ORDER — INVEGA SUSTENNA 156 MG/ML IM SUSY: 156.0000 mg | PREFILLED_SYRINGE | Freq: Once | INTRAMUSCULAR | 2 refills | Status: DC

## 2022-11-18 MED ORDER — INVEGA SUSTENNA 234 MG/1.5ML IM SUSY: 234.0000 mg | PREFILLED_SYRINGE | Freq: Once | INTRAMUSCULAR | 0 refills | Status: DC

## 2022-11-18 MED ORDER — BENZTROPINE MESYLATE 1 MG PO TABS: 1.0000 mg | ORAL_TABLET | Freq: Every day | ORAL | 0 refills | Status: DC

## 2022-11-18 MED ORDER — TRAZODONE HCL 100 MG PO TABS
100.0000 mg | ORAL_TABLET | Freq: Every day | ORAL | 0 refills | Status: DC
Start: 2022-11-18 — End: 2023-01-22

## 2022-11-18 NOTE — Telephone Encounter (Signed)
9:00am 11/18/22 It was explained to the patient that once he get his medication to call the office because the Dr. Mercy Riding need him to get his injection ASAP and after that his next injection in 8 days. Waiting on the patient to call once he get his med.Gregory KitchenMarguerite Olea

## 2022-11-18 NOTE — Progress Notes (Signed)
Psychiatric Initial Adult Assessment   Patient Identification: Gregory Jackson MRN:  [id number] Date of Evaluation:  11/18/2022 Referral Source: PCP Chief Complaint:   Chief Complaint  Patient presents with   Establish Care   Visit Diagnosis:    ICD-10-CM   1. Schizophrenia, paranoid type (HCC)  F20.0 paliperidone (INVEGA SUSTENNA) 156 MG/ML SUSY injection    paliperidone (INVEGA SUSTENNA) 234 MG/1.5ML injection    benztropine (COGENTIN) 1 MG tablet    propranolol (INDERAL) 10 MG tablet    traZODone (DESYREL) 100 MG tablet       Assessment:  Gregory Jackson is a 47 y.o. male with a history of paranoid schizophrenia who presents in person to [facility] at [facility] for initial evaluation on 11/18/2022.    Patient reports a history of paranoid schizophrenia which started in his 30s.  Patient endorsed symptoms of disorganization, paranoia, auditory hallucinations, hypersomnia, decreased self-care, and slowed thought processes.  He denies any command hallucinations, visual hallucinations, symptoms of mania, or thoughts of self-harm/suicide.  [name] symptoms have been well controlled on medication in the past and he had been on long-acting injectable for several years.  While blood work from 2024 did show slightly elevated LDL has improved compared to 6 years prior.  Aims was completed and was within normal limits with no signs of tardive dyskinesia.  While patient was not observed to be restless today both patient and brother reported increased pacing at home.  He had been on propranolol in the past and we agreed to restart it today to treat akathisia symptoms.  Will also restart patient on long-acting injectable working up from an [place] to transition to [place] in a few months.  A number of assessments were performed during the evaluation today including  PHQ-9 which they scored a 7 on, GAD-7 which they scored a 7 on, and [place] suicide severity  screening which showed no risk.    Plan: - Discontinue paliperidone 24H 6 mg when you start the injection.  - Start Invega sustenna 234 mg initial dose followed an [place] 156 mg on day 8 - Continue cogentin 1 mg  - Continue Trazodone 100 mg QHS - CBC, CMP, lipid profile, A1c, TSHreviewed - Crisis resources reviewed - Follow up in a month  History of Present Illness: Gregory Jackson presents alongside his brother.  Initially the patient's brother did most of the talking as patient was soft spoken.  However he did become more engaged as the interview progressed.  They report that they are here today to connect with a psychiatrist after the were no longer able to get in contact with the practitioner they were seeing last year.  In the interim patient's long-acting injectable wore off and he presented to the ED after having recurrence of his schizophrenia symptoms.  At that time he was experiencing auditory hallucinations, was increasingly disorganized, and was wandering around potentially attempting to get into other people's houses per police report.  Patient reports that he was diagnosed with schizophrenia around 10 to 15 years ago.  Initially it presented while he was at work and there were a couple incidents where he thought someone was trying to do something to him.  It is unclear whether this is based in reality or not.  After the second incident is work let him go.  Patient was hospitalized 4 times for short periods around this time.  In addition to paranoia patient endorsed symptoms of disorganization and auditory hallucinations during the episodes.  Initially he had been taking off walking around most the day and sleeping in abandoned houses.  Patient denies ever attempting to harm himself or others and he denies hallucinations being command in nature.  Evaluation for bipolar disorder was completed and he denied any symptoms consistent with mania.  After his hospitalizations patient had been  started on a regimen of Invega and eventually switched to a long-acting injectables which she had been on for 7 to 8 years with stability.  He denies any notable adverse side effects from the medication though is noted to have been on propranolol in the past and is currently on Cogentin.  [name] brother notes that he does seem to pace and is restless often.  Patient is interested in restarting on medications today noting that he prefers the shot to the oral medication.  Since his presentation to the ED back in February 2024 he has been taking oral Invega and reports that the hallucinations have improved.  We agreed to restart on long-acting injectable but explained that we would need to initially start with the 1 month injection before transitioning back to the 11-month injections.  Patient voices understanding.  Risk and benefits of the medication were also reviewed as well as the need for yearly blood work.  Associated Signs/Symptoms: Depression Symptoms:  anhedonia, hypersomnia, disturbed sleep, increased appetite, (Hypo) Manic Symptoms:  Irritable Mood, Anxiety Symptoms:  Excessive Worry, Psychotic Symptoms:  Hallucinations: Auditory Paranoia, Currently controlled with medication PTSD Symptoms: NA  Past Psychiatric History:  Had followed with [facility] for his pyschiatric management in the past however has not been able to see them since the beginning of 2023. Was hospitalized 4 times between 2012 and 2013 for episodes of paranoid schizophrenia.  Patient denies any past suicide attempts.  Has taken Haldol, Zyprexa, and Risperdal Consta have been tried in the past.   Denies any alcohol, marijuana, or other substance use. Does smoke cigarretes and is on 2 packs a day. Increased and around 10 years ago when he lost his job.  Previous Psychotropic Medications: Yes   Substance Abuse History in the last 12 months:  No.  Consequences of Substance Abuse: NA  Past Medical History:  Past Medical  History:  Diagnosis Date   Asthma    Bradycardia 03/09/2013   Schizophrenia (HCC)    No past surgical history on file.  Family Psychiatric History: Denies  Family History:  Family History  Problem Relation Age of Onset   Heart disease Mother     Social History:   Social History   Socioeconomic History   Marital status: Single    Spouse name: Not on file   Number of children: Not on file   Years of education: Not on file   Highest education level: Not on file  Occupational History   Not on file  Tobacco Use   Smoking status: Every Day    Packs/day: 1    Types: Cigarettes   Smokeless tobacco: Never  Substance and Sexual Activity   Alcohol use: No   Drug use: No   Sexual activity: Not Currently  Other Topics Concern   Not on file  Social History Narrative   Not on file   Social Determinants of Health   Financial Resource Strain: Not on file  Food Insecurity: Not on file  Transportation Needs: Not on file  Physical Activity: Not on file  Stress: Not on file  Social Connections: Not on file    Additional Social History: Patient has been  on disability since he lost his job. He graduated high school and went to work after that.  Patient reported being in special classes in high school.  Lives with his dad and brother.  Allergies:   Allergies  Allergen Reactions   Chocolate Rash   Milk-Related Compounds Rash    Metabolic Disorder Labs: No results found for: "HGBA1C", "MPG" No results found for: "PROLACTIN" Lab Results  Component Value Date   CHOL 175 08/29/2016   TRIG 158 (H) 08/29/2016   HDL 33 (L) 08/29/2016   CHOLHDL 5.3 (H) 08/29/2016   LDLCALC 110 (H) 08/29/2016   Lab Results  Component Value Date   TSH 1.290 03/09/2013    Therapeutic Level Labs: No results found for: "LITHIUM" No results found for: "CBMZ" No results found for: "VALPROATE"  Current Medications: Current Outpatient Medications  Medication Sig Dispense Refill   paliperidone  (INVEGA SUSTENNA) 156 MG/ML SUSY injection Inject 1 mL (156 mg total) into the muscle once for 1 dose. 1 mL 2   paliperidone (INVEGA SUSTENNA) 234 MG/1.5ML injection Inject 234 mg into the muscle once for 1 dose. 1.5 mL 0   traZODone (DESYREL) 100 MG tablet Take 1 tablet (100 mg total) by mouth at bedtime. 90 tablet 0   albuterol (VENTOLIN HFA) 108 (90 Base) MCG/ACT inhaler Inhale 2 puffs into the lungs every 4 (four) hours as needed for wheezing or shortness of breath. 18 g 1   benztropine (COGENTIN) 1 MG tablet Take 1 tablet (1 mg total) by mouth daily. 90 tablet 0   cetirizine (ZYRTEC) 10 MG tablet Take 1 tablet once a day or as needed for runny nose or itchy areas 30 tablet 5   fluticasone (FLONASE) 50 MCG/ACT nasal spray One spray once a day or as needed for stuffy nose 16 g 5   paliperidone (INVEGA) 6 MG 24 hr tablet Take 1 tablet (6 mg total) by mouth daily. 30 tablet 1   propranolol (INDERAL) 10 MG tablet Take 1 tablet (10 mg total) by mouth 2 (two) times daily. 60 tablet 1   No current facility-administered medications for this visit.    Musculoskeletal: Strength & Muscle Tone: within normal limits Gait & Station: normal Patient leans: N/A  Psychiatric Specialty Exam: Review of Systems  There were no vitals taken for this visit.There is no height or weight on file to calculate BMI.  General Appearance: Casual and Fairly Groomed  Eye Contact:  Minimal  Speech:   Hesitant  Volume:  Decreased  Mood:  Euthymic  Affect:  Blunt  Thought Process:  Coherent and Goal Directed  Orientation:  Full (Time, Place, and Person)  Thought Content:  Logical  Suicidal Thoughts:  No  Homicidal Thoughts:  No  Memory:  Immediate;   Fair  Judgement:  Fair  Insight:  Fair  Psychomotor Activity:  Normal  Concentration:  Concentration: Fair  Recall:  [employer] of Knowledge:Fair  Language: Good  Akathisia:  NA    AIMS (if indicated):  not done  Assets:  Communication Skills Desire for  Improvement Housing Physical Health Transportation  ADL's:  Intact  Cognition: WNL  Sleep:  Good   Screenings: GAD-7    Flowsheet Row [facility]  Total GAD-7 Score 7      PHQ2-9    Flowsheet Row Office Visit from 11/18/2022 in [facility] Office Visit from 01/01/2018 in [facility] Office Visit from  05/23/2017 in [facility] Office Visit from 08/29/2016 in [facility]  PHQ-2 Total Score 2 4 0 0  PHQ-9 Total Score 7 12 -- --      Flowsheet Row Office Visit from 11/18/2022 in [facility] ED from 09/07/2022 in [facility] Emergency Department at [facility]  C-SSRS RISK CATEGORY No Risk No Risk        Collaboration of Care: Medication Management AEB medication prescription, Psychiatrist AEB chart review, and Other provider involved in patient's care AEB ED chart review  70 minutes were spent in chart review, interview, psycho education, counseling, medical decision making, coordination of care and long-term prognosis.  Patient was given opportunity to ask question and all concerns and questions were addressed and answers. Excluding separately billable services.   Patient/Guardian was advised Release of Information must be obtained prior to any record release in order to collaborate their care with an outside provider. Patient/Guardian was advised if they have not already done so to contact the registration department to sign all necessary forms in order for [place] to release information regarding their care.   Consent: Patient/Guardian gives verbal consent for treatment and assignment of benefits for services provided during this visit. Patient/Guardian expressed understanding and agreed to proceed.   Stasia Cavalier,  MD 5/6/202411:52 AM

## 2022-11-18 NOTE — Patient Instructions (Addendum)
Thank you for attending your appointment today.  - Discontinue paliperidone 24H pill  6 mg when you start the injection.  - Start Invega sustenna 234 mg initial dose followed an [place] 156 mg on day 8 - Continue cogentin 1 mg daily - Continue Trazodone 100 mg at bedtime - Start propranolol 10 mg twice a day  Please do not make any changes to medications without first discussing with your provider. If you are experiencing a psychiatric emergency, please call 911 or present to your nearest emergency department. Additional crisis, medication management, and therapy resources are included below.  [facility]  [address] [place], [place] [address] [phone] or [phone] [facility] 24/7 FOR ANYONE [address] [place], [place]  [phone] Fax: [phone] [email or web address] *Interpreters available *Accepts all insurance and uninsured for Urgent Care needs

## 2022-11-20 ENCOUNTER — Other Ambulatory Visit (HOSPITAL_COMMUNITY): Payer: Self-pay | Admitting: Psychiatry

## 2022-11-20 DIAGNOSIS — F200 Paranoid schizophrenia: Secondary | ICD-10-CM

## 2022-11-20 MED ORDER — INVEGA SUSTENNA 156 MG/ML IM SUSY
156.0000 mg | PREFILLED_SYRINGE | Freq: Once | INTRAMUSCULAR | 2 refills | Status: DC
Start: 2022-11-20 — End: 2023-01-21

## 2022-11-20 MED ORDER — INVEGA SUSTENNA 156 MG/ML IM SUSY
156.0000 mg | PREFILLED_SYRINGE | Freq: Once | INTRAMUSCULAR | 2 refills | Status: DC
Start: 2022-11-20 — End: 2022-11-20

## 2022-11-20 MED ORDER — INVEGA SUSTENNA 234 MG/1.5ML IM SUSY
234.0000 mg | PREFILLED_SYRINGE | Freq: Once | INTRAMUSCULAR | 0 refills | Status: DC
Start: 2022-11-20 — End: 2023-01-21

## 2022-11-26 ENCOUNTER — Ambulatory Visit (HOSPITAL_BASED_OUTPATIENT_CLINIC_OR_DEPARTMENT_OTHER): Payer: 59 | Admitting: *Deleted

## 2022-11-26 VITALS — BP 134/76 | HR 63 | Resp 18 | Ht 77.0 in | Wt 244.1 lb

## 2022-11-26 DIAGNOSIS — F200 Paranoid schizophrenia: Secondary | ICD-10-CM | POA: Diagnosis not present

## 2022-11-26 MED ORDER — PALIPERIDONE PALMITATE ER 234 MG/1.5ML IM SUSY
234.0000 mg | PREFILLED_SYRINGE | Freq: Once | INTRAMUSCULAR | Status: AC
  Administered 2022-11-26: 234 mg via INTRAMUSCULAR

## 2022-11-26 NOTE — Patient Instructions (Addendum)
Pt presents today for due Invega Sustenna 234 mg injection. Pt says he has not had this injection since 10/05/22 when his ACT team "must've got mad at me" as services where apparently terminated at that times. Pt presents as guarded with limited eye contact. Suspicious on initial contact. Pt was appropriate regarding injection. Pt denies AVH but endorses "nightmares" however thoughts seem disorganized. Pt denies SI, SI, AVH. Injection prepared and given as ordered in RD without complaint. Pt is able to verbalize that he will be transitioning to the [place] (3 month LAI). Pt shown to check out desk and appointment made for next due injection in approximately 30 days. Pt will f/u with Dr. Mercy Riding on 12/18/22.

## 2022-11-26 NOTE — Patient Instructions (Incomplete)
1. Seasonal and perennial allergic rhinitis (grasses, trees, indoor molds, outdoor molds, cat and dog) - Continue Flonase one spray per nostril twice daily to see if this will help with your nasal drainage. - Continue Zyrtec 10 mg daily for allergy control. - start azelastine 1-2 sprays in each nostril once to twice a day as needed for runny nose/drainage down throat  2. Shortness of breath - discussed the importance of decreasing smoking.  - Daily controller medication(s):restart Trelegy 200/62.5/25 one puff once daily. Rinse mouth out after to help prevent thrush - Prior to physical activity: albuterol 2 puffs 10-15 minutes before physical activity. - Rescue medications: albuterol 4 puffs every 4-6 hours as needed - Asthma control goals:  * Full participation in all desired activities (may need albuterol before activity) * Albuterol use two time or less a week on average (not counting use with activity) * Cough interfering with sleep two time or less a month * Oral steroids no more than once a year * No hospitalizations  3. Schedule a follow up appointment in 6 weeks or sooner if needed

## 2022-11-27 ENCOUNTER — Other Ambulatory Visit: Payer: Self-pay

## 2022-11-27 ENCOUNTER — Encounter: Payer: Self-pay | Admitting: Family

## 2022-11-27 ENCOUNTER — Ambulatory Visit (INDEPENDENT_AMBULATORY_CARE_PROVIDER_SITE_OTHER): Payer: 59 | Admitting: Family

## 2022-11-27 VITALS — BP 134/86 | HR 96 | Temp 98.0°F | Resp 16 | Ht 76.0 in | Wt 243.2 lb

## 2022-11-27 DIAGNOSIS — F17210 Nicotine dependence, cigarettes, uncomplicated: Secondary | ICD-10-CM

## 2022-11-27 DIAGNOSIS — J3089 Other allergic rhinitis: Secondary | ICD-10-CM

## 2022-11-27 DIAGNOSIS — J302 Other seasonal allergic rhinitis: Secondary | ICD-10-CM | POA: Diagnosis not present

## 2022-11-27 DIAGNOSIS — R0602 Shortness of breath: Secondary | ICD-10-CM

## 2022-11-27 MED ORDER — ALBUTEROL SULFATE HFA 108 (90 BASE) MCG/ACT IN AERS: 2.0000 | INHALATION_SPRAY | RESPIRATORY_TRACT | 1 refills | Status: AC | PRN

## 2022-11-27 MED ORDER — TRELEGY ELLIPTA 200-62.5-25 MCG/ACT IN AEPB: INHALATION_SPRAY | RESPIRATORY_TRACT | 2 refills | Status: DC

## 2022-11-27 NOTE — Progress Notes (Signed)
[address] [place] [address] Dept: [phone]  FOLLOW UP NOTE  Patient ID: Gregory Jackson, male    DOB: 29-Oct-1975  Age: 47 y.o. MRN: [id number] Date of Office Visit: 11/27/2022  Assessment  Chief Complaint: Asthma  HPI Gregory Jackson is a 47 year old male who presents today for follow-up of seasonal and perennial allergic rhinitis and shortness of breath.  He was last seen on May 29, 2022 by Dr. Dellis Anes.  His brother is here with him today and helps provide history.  He reports no new diagnosis or surgeries since his last office visit.  He did start his Invega injections yesterday.  Seasonal and perennial allergic rhinitis: He is currently taking Zyrtec as needed and Flonase nasal spray every now and then.  He reports constant clear thick rhinorrhea, nasal congestion every once a while and possible postnasal drip.  He has not been treated for any sinus infections since we last saw him.  Shortness of breath: He is currently not taking Trelegy 200 mcg 1 puff once a day as prescribed at his last office visit.  He reports that when he was on this medication it did stop him from coughing.  He also reports that he is out of albuterol.  He continues to smoke and reports that he smokes about 2 packs/day.  He reports coughing and denies wheezing, tightness in chest, shortness of breath, nocturnal awakenings, and fevers.  Since his last office visit he has not required any systemic steroids or made any trips to the emergency room or urgent care due to breathing problems.     Drug Allergies:  Allergies  Allergen Reactions   Chocolate Rash   Milk-Related Compounds Rash    Review of Systems: Review of Systems  Constitutional:  Negative for chills and fever.  HENT:         Reports constant thick clear rhinorrhea, every once in a while nasal congestion, and possible post nasal drip  Eyes:        Reports left eye a little burning of his left eye. Denies itchy  watery eyes  Respiratory:  Positive for cough. Negative for shortness of breath and wheezing.   Cardiovascular:  Negative for chest pain and palpitations.  Gastrointestinal:        Denies heartburn or reflux symptoms  Skin:  Negative for itching and rash.  Neurological:  Negative for headaches.  Endo/Heme/Allergies:  Positive for environmental allergies.     Physical Exam: BP 134/86   Pulse 96   Temp 98 F (36.7 C)   Resp 16   Ht 6\' 4"  (1.93 m)   Wt 243 lb 3.2 oz (110.3 kg)   SpO2 97%   BMI 29.60 kg/m    Physical Exam Exam conducted with a chaperone present (brother present).  Constitutional:      Appearance: Normal appearance.  HENT:     Head: Normocephalic and atraumatic.     Comments: Pharynx normal. Eyes normal. Ears normal. Nose normal    Right Ear: Tympanic membrane, ear canal and external ear normal.     Left Ear: Tympanic membrane, ear canal and external ear normal.     Nose: Nose normal.     Mouth/Throat:     Mouth: Mucous membranes are moist.     Pharynx: Oropharynx is clear.  Eyes:     Conjunctiva/sclera: Conjunctivae normal.  Cardiovascular:     Rate and Rhythm: Regular rhythm.     Heart sounds: Normal heart sounds.  Pulmonary:     Effort: Pulmonary effort is normal.     Breath sounds: Normal breath sounds.     Comments: Lungs clear to auscultation Musculoskeletal:     Cervical back: Neck supple.  Skin:    General: Skin is warm.  Neurological:     Mental Status: He is alert and oriented to person, place, and time.  Psychiatric:        Mood and Affect: Mood normal.        Behavior: Behavior normal.        Thought Content: Thought content normal.        Judgment: Judgment normal.     Diagnostics: FVC 4.44 L (85%), FEV1 3.18 L (77%).  Spirometry indicates normal spirometry.  Assessment and Plan: 1. SOB (shortness of breath)   2. Seasonal and perennial allergic rhinitis   3. Continuous dependence on cigarette smoking     Meds ordered this  encounter  Medications   Fluticasone-Umeclidin-Vilant (TRELEGY ELLIPTA) 200-62.5-25 MCG/ACT AEPB    Sig: Inhale 1 puff once a day.  Rinse mouth out afterwards.    Dispense:  28 each    Refill:  2   albuterol (VENTOLIN HFA) 108 (90 Base) MCG/ACT inhaler    Sig: Inhale 2 puffs into the lungs every 4 (four) hours as needed for wheezing or shortness of breath.    Dispense:  18 g    Refill:  1    Patient Instructions  1. Seasonal and perennial allergic rhinitis (grasses, trees, indoor molds, outdoor molds, cat and dog) - Continue Flonase one spray per nostril twice daily to see if this will help with your nasal drainage. - Continue Zyrtec 10 mg daily for allergy control. - start azelastine 1-2 sprays in each nostril once to twice a day as needed for runny nose/drainage down throat  2. Shortness of breath - discussed the importance of decreasing smoking.  - Daily controller medication(s):restart Trelegy 200/62.5/25 one puff once daily. Rinse mouth out after to help prevent thrush - Prior to physical activity: albuterol 2 puffs 10-15 minutes before physical activity. - Rescue medications: albuterol 4 puffs every 4-6 hours as needed - Asthma control goals:  * Full participation in all desired activities (may need albuterol before activity) * Albuterol use two time or less a week on average (not counting use with activity) * Cough interfering with sleep two time or less a month * Oral steroids no more than once a year * No hospitalizations  3. Schedule a follow up appointment in 6 weeks or sooner if needed   Return in about 6 weeks (around 01/08/2023), or if symptoms worsen or fail to improve.    Thank you for the opportunity to care for this patient.  Please do not hesitate to contact me with questions.  Nehemiah Settle, FNP [facility]

## 2022-11-28 NOTE — Addendum Note (Signed)
Addended by: Briant Cedar L on: 11/28/2022 11:17 AM   Modules accepted: Orders

## 2022-11-29 ENCOUNTER — Other Ambulatory Visit: Payer: Self-pay | Admitting: Allergy & Immunology

## 2022-12-18 ENCOUNTER — Encounter (HOSPITAL_COMMUNITY): Payer: Self-pay | Admitting: Psychiatry

## 2022-12-18 ENCOUNTER — Ambulatory Visit (HOSPITAL_BASED_OUTPATIENT_CLINIC_OR_DEPARTMENT_OTHER): Payer: 59 | Admitting: Psychiatry

## 2022-12-18 ENCOUNTER — Other Ambulatory Visit: Payer: Self-pay

## 2022-12-18 DIAGNOSIS — F200 Paranoid schizophrenia: Secondary | ICD-10-CM

## 2022-12-18 MED ORDER — BENZTROPINE MESYLATE 1 MG PO TABS: 1.0000 mg | ORAL_TABLET | Freq: Every day | ORAL | 0 refills | Status: DC

## 2022-12-18 MED ORDER — PROPRANOLOL HCL 10 MG PO TABS
10.0000 mg | ORAL_TABLET | Freq: Two times a day (BID) | ORAL | 1 refills | Status: DC
Start: 2022-12-18 — End: 2023-01-22

## 2022-12-18 NOTE — Progress Notes (Signed)
BH MD/PA/NP OP Progress Note  12/18/2022 10:01 AM Gregory Jackson  MRN:  [id number]  Visit Diagnosis:    ICD-10-CM   1. Schizophrenia, paranoid type (HCC)  F20.0 propranolol (INDERAL) 10 MG tablet    benztropine (COGENTIN) 1 MG tablet      Assessment: Gregory Jackson is a 47 y.o. male with a history of paranoid schizophrenia who presents in person to [facility] at [facility] for initial evaluation on 11/18/2022.    At initial evaluation patient reported a history of paranoid schizophrenia which started in his 30s.  Patient endorsed symptoms of disorganization, paranoia, auditory hallucinations, hypersomnia, decreased self-care, and slowed thought processes.  He denies any command hallucinations, visual hallucinations, symptoms of mania, or thoughts of self-harm/suicide.  [name] symptoms have been well controlled on medication in the past and he had been on long-acting injectable for several years.  While blood work from 2024 did show slightly elevated LDL has improved compared to 6 years prior.  Aims was completed and was within normal limits with no signs of tardive dyskinesia.    Gregory Jackson presents for follow-up evaluation. Today, 12/18/22, patient reports that mood has been stable and there has been no evidence of delusions, paranoia, or psychosis since starting Invega Systane again.  Also denies any disorganization.  Patient has noticed some increase in pacing since starting medication which we will continue to monitor as he is already on propranolol and is scheduled to decrease Invega to 156 at his next appointment.  After patient receives [place] 156 mg for a few months we can consider transitioning back to [place].  Plan: - Discontinue paliperidone 24H 6 mg when you start the injection.  - Received Invega sustenna 234 mg initial dose on 5/14 - Due for Invega Sustenna 156 mg on 6/13 - Can consider transition to Eyvonne Mechanic in a few months. -  Continue Propranolol 10 mg BID - Continue cogentin 1 mg QD - Continue Trazodone 100 mg QHS - CBC, CMP, lipid profile, A1c, TSH reviewed - Crisis resources reviewed - Follow up in a 4-6 weeks   Chief Complaint:  Chief Complaint  Patient presents with   Follow-up   HPI: Gregory Jackson presents reporting that things have gone all right the past month.  He has started on [place] and notes an improvement in the disorganization, paranoia, auditory hallucinations, command hallucinations, and hypersomnia.  He was observed to be a bit more engaged on interview today.  Patient reports that his appetite and sleep have been well-controlled and he denies any twitching or muscle tension since starting medication.  Gregory Jackson has noticed that he has been pacing a bit more since starting the medication.  He describes it being a bit hard to sit still at times.  We adjust the propranolol which she is taking and he denies adverse side effects from it.  As patient will be decreasing the [place] to 156 at his next injection in a week we will continue to monitor and see if symptoms improved with decreased dose.  Past Psychiatric History: Had followed with [facility] for his pyschiatric management in the past however has not been able to see them since the beginning of 2023. Was hospitalized 4 times between 2012 and 2013 for episodes of paranoid schizophrenia.  Patient denies any past suicide attempts.  Has taken Haldol, Zyprexa, and Risperdal Consta have been tried in the past.   Denies any alcohol, marijuana, or other substance use. Does smoke cigarretes and  is on 2 packs a day. Increased and around 10 years ago when he lost his job.  Past Medical History:  Past Medical History:  Diagnosis Date   Asthma    Bradycardia 03/09/2013   Schizophrenia (HCC)    History reviewed. No pertinent surgical history.  Family History:  Family History  Problem Relation Age of Onset   Heart disease Mother     Social History:   Social History   Socioeconomic History   Marital status: Single    Spouse name: Not on file   Number of children: Not on file   Years of education: Not on file   Highest education level: Not on file  Occupational History   Not on file  Tobacco Use   Smoking status: Every Day    Packs/day: 1    Types: Cigarettes   Smokeless tobacco: Never  Substance and Sexual Activity   Alcohol use: No   Drug use: No   Sexual activity: Not Currently  Other Topics Concern   Not on file  Social History Narrative   Not on file   Social Determinants of Health   Financial Resource Strain: Not on file  Food Insecurity: Not on file  Transportation Needs: Not on file  Physical Activity: Not on file  Stress: Not on file  Social Connections: Not on file    Allergies:  Allergies  Allergen Reactions   Chocolate Rash   Milk-Related Compounds Rash    Current Medications: Current Outpatient Medications  Medication Sig Dispense Refill   albuterol (VENTOLIN HFA) 108 (90 Base) MCG/ACT inhaler Inhale 2 puffs into the lungs every 4 (four) hours as needed for wheezing or shortness of breath. 18 g 1   cetirizine (ZYRTEC) 10 MG tablet Take 1 tablet once a day or as needed for runny nose or itchy areas 30 tablet 5   fluticasone (FLONASE) 50 MCG/ACT nasal spray One spray once a day or as needed for stuffy nose 16 g 5   Fluticasone-Umeclidin-Vilant (TRELEGY ELLIPTA) 200-62.5-25 MCG/ACT AEPB USE 1 INHALATION DAILY 60 each 1   paliperidone (INVEGA) 6 MG 24 hr tablet Take 1 tablet (6 mg total) by mouth daily. 30 tablet 1   traZODone (DESYREL) 100 MG tablet Take 1 tablet (100 mg total) by mouth at bedtime. 90 tablet 0   benztropine (COGENTIN) 1 MG tablet Take 1 tablet (1 mg total) by mouth daily. 90 tablet 0   paliperidone (INVEGA SUSTENNA) 156 MG/ML SUSY injection Inject 1 mL (156 mg total) into the muscle once for 1 dose. First 156 mg injection to be given 8 days after 234 mg initial dose. Patient will then  receive 156 mg injection once every 28 days 1 mL 2   paliperidone (INVEGA SUSTENNA) 234 MG/1.5ML injection Inject 234 mg into the muscle once for 1 dose. 1.5 mL 0   propranolol (INDERAL) 10 MG tablet Take 1 tablet (10 mg total) by mouth 2 (two) times daily. 60 tablet 1   No current facility-administered medications for this visit.     Musculoskeletal: Strength & Muscle Tone: within normal limits Gait & Station: normal Patient leans: N/A  Psychiatric Specialty Exam: Review of Systems  Blood pressure 128/80, pulse (!) 55, height 6\' 5"  (1.956 m), weight 246 lb (111.6 kg).Body mass index is 29.17 kg/m.  General Appearance: Fairly Groomed  Eye Contact:  Fair  Speech:  Normal Rate  Volume:  Decreased  Mood:  Euthymic  Affect:  Congruent  Thought Process:  Coherent and Goal  Directed  Orientation:  Full (Time, Place, and Person)  Thought Content: Logical   Suicidal Thoughts:  No  Homicidal Thoughts:  No  Memory:  Immediate;   Fair  Judgement:  Fair  Insight:  Fair  Psychomotor Activity:   Reports periods of akathisia though not observed today  Concentration:  Concentration: Good  Recall:  Good  Fund of Knowledge: Fair  Language: Good  Akathisia:   Reported but not observed    AIMS (if indicated): not done  Assets:  Communication Skills Desire for Improvement Housing Social Support  ADL's:  Intact  Cognition: WNL  Sleep:  Good   Metabolic Disorder Labs: No results found for: "HGBA1C", "MPG" No results found for: "PROLACTIN" Lab Results  Component Value Date   CHOL 175 08/29/2016   TRIG 158 (H) 08/29/2016   HDL 33 (L) 08/29/2016   CHOLHDL 5.3 (H) 08/29/2016   LDLCALC 110 (H) 08/29/2016   Lab Results  Component Value Date   TSH 1.290 03/09/2013   TSH 3.680 12/27/2011    Therapeutic Level Labs: No results found for: "LITHIUM" No results found for: "VALPROATE" No results found for: "CBMZ"   Screenings: GAD-7    Flowsheet Row [facility] Visit [facility]18/2022 in  [facility]  Total GAD-7 Score 7      PHQ2-9    Flowsheet Row Office Visit from 11/18/2022 in [facility] Office Visit from 01/01/2018 in [facility] Office Visit from 05/23/2017 in [facility] Office Visit from 08/29/2016 in [facility]  PHQ-2 Total Score 2 4 0 0  PHQ-9 Total Score 7 12 -- --      Flowsheet Row Office Visit from 11/18/2022 in [facility] ED from 09/07/2022 in [facility] Emergency Department at [facility]  C-SSRS RISK CATEGORY No Risk No Risk       Collaboration of Care: Collaboration of Care: Medication Management AEB medication prescription  30 minutes were spent in chart review, interview, psycho education, counseling, medical decision making, coordination of care and long-term prognosis.  Patient was given opportunity to ask question and all concerns and questions were addressed and answers. Excluding separately billable services.   Patient/Guardian was advised Release of Information must be obtained prior to any record release in order to collaborate their care with an outside provider. Patient/Guardian was advised if they have not already done so to contact the registration department to sign all necessary forms in order for [place] to release information regarding their care.   Consent: Patient/Guardian gives verbal consent for treatment and assignment of benefits for services provided during this visit. Patient/Guardian expressed understanding and agreed to proceed.    Stasia Cavalier, MD 12/18/2022, 10:01 AM

## 2022-12-26 ENCOUNTER — Ambulatory Visit (HOSPITAL_BASED_OUTPATIENT_CLINIC_OR_DEPARTMENT_OTHER): Payer: 59 | Admitting: *Deleted

## 2022-12-26 VITALS — BP 137/81 | HR 50 | Resp 18 | Ht 77.0 in | Wt 247.8 lb

## 2022-12-26 DIAGNOSIS — F200 Paranoid schizophrenia: Secondary | ICD-10-CM | POA: Diagnosis not present

## 2022-12-26 MED ORDER — PALIPERIDONE PALMITATE ER 156 MG/ML IM SUSY
156.0000 mg | PREFILLED_SYRINGE | Freq: Once | INTRAMUSCULAR | Status: AC
  Administered 2022-12-26: 156 mg via INTRAMUSCULAR

## 2022-12-26 NOTE — Patient Instructions (Signed)
Pt in office today for due Invega Sustenna 156 mg injection. Pt cooperative on approach. Pt presents as disheveled with poor hygiene and body odor. Eye contact poor. When asked how pt is doing since last injection pt started mumbling about "running". Writer asked pt to clarify and he stated he used to go to the park and run. Pt is difficult to understand and speaks softly. Pt denies any AV but after inquiring after AH pt did endorse AH but denies they are command in nature. Injection was prepared as ordered and given in LD without complaint. Pt to return in approximately 28 days for next due injection.

## 2023-01-10 ENCOUNTER — Ambulatory Visit: Payer: 59 | Admitting: Family Medicine

## 2023-01-21 ENCOUNTER — Other Ambulatory Visit (HOSPITAL_COMMUNITY): Payer: Self-pay | Admitting: *Deleted

## 2023-01-21 DIAGNOSIS — F200 Paranoid schizophrenia: Secondary | ICD-10-CM

## 2023-01-21 MED ORDER — INVEGA SUSTENNA 156 MG/ML IM SUSY
156.0000 mg | PREFILLED_SYRINGE | Freq: Once | INTRAMUSCULAR | 0 refills | Status: DC
Start: 2023-01-21 — End: 2023-03-11

## 2023-01-22 ENCOUNTER — Ambulatory Visit (HOSPITAL_COMMUNITY): Payer: 59 | Admitting: Psychiatry

## 2023-01-22 ENCOUNTER — Encounter (HOSPITAL_COMMUNITY): Payer: Self-pay | Admitting: Psychiatry

## 2023-01-22 DIAGNOSIS — F200 Paranoid schizophrenia: Secondary | ICD-10-CM | POA: Diagnosis not present

## 2023-01-22 MED ORDER — TRAZODONE HCL 100 MG PO TABS: 100.0000 mg | ORAL_TABLET | Freq: Every day | ORAL | 0 refills | Status: DC

## 2023-01-22 MED ORDER — PROPRANOLOL HCL 10 MG PO TABS
10.0000 mg | ORAL_TABLET | Freq: Two times a day (BID) | ORAL | 1 refills | Status: DC
Start: 2023-01-22 — End: 2023-03-11

## 2023-01-22 NOTE — Progress Notes (Signed)
BH MD/PA/NP OP Progress Note  01/22/2023 9:26 AM Gregory Jackson  MRN:  [id number]  Visit Diagnosis:    ICD-10-CM   1. Schizophrenia, paranoid type (HCC)  F20.0 propranolol (INDERAL) 10 MG tablet    traZODone (DESYREL) 100 MG tablet       Assessment: Gregory Jackson is a 47 y.o. male with a history of paranoid schizophrenia who presents in person to [facility] at [facility] for initial evaluation on 11/18/2022.    At initial evaluation patient reported a history of paranoid schizophrenia which started in his 30s.  Patient endorsed symptoms of disorganization, paranoia, auditory hallucinations, hypersomnia, decreased self-care, and slowed thought processes.  He denies any command hallucinations, visual hallucinations, symptoms of mania, or thoughts of self-harm/suicide.  [name] symptoms have been well controlled on medication in the past and he had been on long-acting injectable for several years.  While blood work from 2024 did show slightly elevated LDL has improved compared to 6 years prior.  Aims was completed and was within normal limits with no signs of tardive dyskinesia.    Gregory Jackson presents for follow-up evaluation. Today, 01/22/23, patient reports that mood has been stable along with any signs of psychosis including delusions, paranoia, disorganization, or auditory/visual hallucinations.  He has been compliant with his Invega Systane injections and denies any adverse side effects.  We will plan to transition to [place] following his next appointment.  Plan: - Discontinue paliperidone 24H 6 mg when you start the injection.  - Received Invega sustenna 234 mg initial dose on 6/13 - Due for Invega Sustenna 156 mg on 7/11 - Can consider transition to [place] in September. - Continue Propranolol 10 mg BID - Continue cogentin 1 mg QD - Continue Trazodone 100 mg QHS - CBC, CMP, lipid profile, A1c, TSH reviewed - Crisis resources reviewed -  Follow up in a 6 weeks   Chief Complaint:  Chief Complaint  Patient presents with   Follow-up   HPI: Gregory Jackson presents reporting last month have gone alright for him.  He has been consistent with his Invega injection receiving his last 1 on 6/13 and is due for another on 7/11.  Patient denies any notable side effects from the medication other than some soreness at the injection site.  He also has endorsed some increased fatigue over the past month and that he has been sleeping a bit more.  This increased fatigue however appears to be more related to the weather and increased heat which makes Alexavier tired after being outside for around 5 minutes.  He denies any auditory or visual hallucinations along with any disorganization or concerns of paranoia.  [name] brother was talked with after who endorsed that Gregory Jackson has done well with no concerns of any psychosis.  Patient also denied any concerns of akathisia or muscle twitches/contractions.  He was not observed to show any smacking of his lips or gumming of his teeth.  Patient notes that he is smoking a bit less around half a pack a day currently. We discussed continuing on his current regimen with the plan to switch to Eyvonne Mechanic in September doing he tolerates the injection scheduled for tomorrow and in August well.  Past Psychiatric History: Had followed with [facility] for his pyschiatric management in the past however has not been able to see them since the beginning of 2023. Was hospitalized 4 times between 2012 and 2013 for episodes of paranoid schizophrenia.  Patient denies any past suicide attempts.  Has taken Haldol, Zyprexa, and Risperdal Consta have been tried in the past.   Denies any alcohol, marijuana, or other substance use. Does smoke cigarretes and is on 2 packs a day. Increased and around 10 years ago when he lost his job.  Past Medical History:  Past Medical History:  Diagnosis Date   Asthma    Bradycardia 03/09/2013   Schizophrenia  (HCC)    No past surgical history on file.  Family History:  Family History  Problem Relation Age of Onset   Heart disease Mother     Social History:  Social History   Socioeconomic History   Marital status: Single    Spouse name: Not on file   Number of children: Not on file   Years of education: Not on file   Highest education level: Not on file  Occupational History   Not on file  Tobacco Use   Smoking status: Every Day    Packs/day: 1    Types: Cigarettes   Smokeless tobacco: Never  Substance and Sexual Activity   Alcohol use: No   Drug use: No   Sexual activity: Not Currently  Other Topics Concern   Not on file  Social History Narrative   Not on file   Social Determinants of Health   Financial Resource Strain: Not on file  Food Insecurity: Not on file  Transportation Needs: Not on file  Physical Activity: Not on file  Stress: Not on file  Social Connections: Not on file    Allergies:  Allergies  Allergen Reactions   Chocolate Rash   Milk-Related Compounds Rash    Current Medications: Current Outpatient Medications  Medication Sig Dispense Refill   albuterol (VENTOLIN HFA) 108 (90 Base) MCG/ACT inhaler Inhale 2 puffs into the lungs every 4 (four) hours as needed for wheezing or shortness of breath. 18 g 1   benztropine (COGENTIN) 1 MG tablet Take 1 tablet (1 mg total) by mouth daily. 90 tablet 0   cetirizine (ZYRTEC) 10 MG tablet Take 1 tablet once a day or as needed for runny nose or itchy areas 30 tablet 5   fluticasone (FLONASE) 50 MCG/ACT nasal spray One spray once a day or as needed for stuffy nose 16 g 5   Fluticasone-Umeclidin-Vilant (TRELEGY ELLIPTA) 200-62.5-25 MCG/ACT AEPB USE 1 INHALATION DAILY 60 each 1   propranolol (INDERAL) 10 MG tablet Take 1 tablet (10 mg total) by mouth 2 (two) times daily. 60 tablet 1   traZODone (DESYREL) 100 MG tablet Take 1 tablet (100 mg total) by mouth at bedtime. 90 tablet 0   paliperidone (INVEGA SUSTENNA) 156  MG/ML SUSY injection Inject 1 mL (156 mg total) into the muscle once for 1 dose. First 156 mg injection to be given 8 days after 234 mg initial dose. Patient will then receive 156 mg injection once every 28 days 1 mL 0   No current facility-administered medications for this visit.     Musculoskeletal: Strength & Muscle Tone: within normal limits Gait & Station: normal Patient leans: N/A  Psychiatric Specialty Exam: Review of Systems  Blood pressure (!) 140/82, pulse 78, height 6\' 5"  (1.956 m), weight 250 lb (113.4 kg).Body mass index is 29.65 kg/m.  General Appearance: Fairly Groomed  Eye Contact:  Fair  Speech:  Normal Rate  Volume:  Decreased  Mood:  Euthymic  Affect:  Congruent  Thought Process:  Coherent and Goal Directed  Orientation:  Full (Time, Place, and Person)  Thought Content: Logical  Suicidal Thoughts:  No  Homicidal Thoughts:  No  Memory:  Immediate;   Fair  Judgement:  Fair  Insight:  Fair  Psychomotor Activity:  Normal  Concentration:  Concentration: Good  Recall:  Good  Fund of Knowledge: Fair  Language: Good  Akathisia:  No    AIMS (if indicated): not done  Assets:  Communication Skills Desire for Improvement Housing Social Support  ADL's:  Intact  Cognition: WNL  Sleep:  Good   Metabolic Disorder Labs: No results found for: "HGBA1C", "MPG" No results found for: "PROLACTIN" Lab Results  Component Value Date   CHOL 175 08/29/2016   TRIG 158 (H) 08/29/2016   HDL 33 (L) 08/29/2016   CHOLHDL 5.3 (H) 08/29/2016   LDLCALC 110 (H) 08/29/2016   Lab Results  Component Value Date   TSH 1.290 03/09/2013   TSH 3.680 12/27/2011    Therapeutic Level Labs: No results found for: "LITHIUM" No results found for: "VALPROATE" No results found for: "CBMZ"   Screenings: GAD-7    Flowsheet Row Office Visit from 11/18/2022 in [facility]  Total GAD-7 Score 7      PHQ2-9    Flowsheet Row Office Visit from  11/18/2022 in [facility] Office Visit from 01/01/2018 in [facility] Office Visit from 05/23/2017 in [facility] Office Visit from 08/29/2016 in [facility]  PHQ-2 Total Score 2 4 0 0  PHQ-9 Total Score 7 12 -- --      Flowsheet Row Office Visit from 11/18/2022 in [facility] ED from 09/07/2022 in [facility] Emergency Department at [facility]  C-SSRS RISK CATEGORY No Risk No Risk       Collaboration of Care: Collaboration of Care: Medication Management AEB medication prescription  30 minutes were spent in chart review, interview, psycho education, counseling, medical decision making, coordination of care and long-term prognosis.  Patient was given opportunity to ask question and all concerns and questions were addressed and answers. Excluding separately billable services.   Patient/Guardian was advised Release of Information must be obtained prior to any record release in order to collaborate their care with an outside provider. Patient/Guardian was advised if they have not already done so to contact the registration department to sign all necessary forms in order for [place] to release information regarding their care.   Consent: Patient/Guardian gives verbal consent for treatment and assignment of benefits for services provided during this visit. Patient/Guardian expressed understanding and agreed to proceed.    Stasia Cavalier, MD 01/22/2023, 9:26 AM

## 2023-01-23 ENCOUNTER — Ambulatory Visit (HOSPITAL_BASED_OUTPATIENT_CLINIC_OR_DEPARTMENT_OTHER): Payer: 59 | Admitting: *Deleted

## 2023-01-23 VITALS — BP 140/80 | Resp 18 | Ht 77.0 in | Wt 250.0 lb

## 2023-01-23 DIAGNOSIS — F200 Paranoid schizophrenia: Secondary | ICD-10-CM | POA: Diagnosis not present

## 2023-01-23 MED ORDER — PALIPERIDONE PALMITATE ER 156 MG/ML IM SUSY
156.0000 mg | PREFILLED_SYRINGE | Freq: Once | INTRAMUSCULAR | Status: AC
  Administered 2023-01-23: 156 mg via INTRAMUSCULAR

## 2023-01-23 NOTE — Patient Instructions (Addendum)
Pt presents for due Invega Sustenna 156 mg injection. Pt is transitioning from 234 mg and then next month will restart the [place]. Pt is cooperative on approach. Pt a bit disheveled with some body odor. Pt is pleasant and conversation more organized today. Pt denies any psychotic symptoms and does not appear to be responding to any internal stimuli. Injection prepared and given as ordered and administered in RD without complaint from pt. Pt to return in approximately 28 days for next due injection. Pt made appointment when checking out. Brother Ivar Drape present.

## 2023-02-24 ENCOUNTER — Ambulatory Visit (HOSPITAL_COMMUNITY): Payer: 59

## 2023-02-25 ENCOUNTER — Ambulatory Visit (HOSPITAL_BASED_OUTPATIENT_CLINIC_OR_DEPARTMENT_OTHER): Payer: 59 | Admitting: *Deleted

## 2023-02-25 VITALS — BP 122/80 | HR 49 | Resp 18 | Ht 77.0 in | Wt 243.8 lb

## 2023-02-25 DIAGNOSIS — F200 Paranoid schizophrenia: Secondary | ICD-10-CM | POA: Diagnosis not present

## 2023-02-25 MED ORDER — PALIPERIDONE PALMITATE ER 156 MG/ML IM SUSY
156.0000 mg | PREFILLED_SYRINGE | Freq: Once | INTRAMUSCULAR | Status: AC
  Administered 2023-02-25: 156 mg via INTRAMUSCULAR

## 2023-02-25 NOTE — Patient Instructions (Signed)
Pt presents today for due Invega 156 mg injection. Pt disheveled per usual presentation. Cooperative but guarded with minimal eye contact. Pt does not respond when asked about AVH but was responding to internal stimuli when this nurse was preparing injection. Pt did say he's been suffering with dental pain or problems. Injection prepared as ordered and given in RD without complaint. Pt says he is to transition to [place] and will schedule next injection for 3 months out. This has been the plan to transition. Pt encouraged to call or have brother call nurse line with any quesitons or concerns prior to next visit. Pt agrees.

## 2023-03-10 NOTE — Progress Notes (Unsigned)
BH MD/PA/NP OP Progress Note  03/11/2023 2:52 PM MYER DAGGETT  MRN:  [id number]  Visit Diagnosis:    ICD-10-CM   1. Schizophrenia, paranoid type (HCC)  F20.0 Paliperidone Palmitate ER (INVEGA TRINZA) 546 MG/1.75ML injection    traZODone (DESYREL) 100 MG tablet    benztropine (COGENTIN) 1 MG tablet    propranolol (INDERAL) 10 MG tablet        Assessment: Gregory Jackson is a 47 y.o. male with a history of paranoid schizophrenia who presents in person to [facility] at [facility] for initial evaluation on 11/18/2022.    At initial evaluation patient reported a history of paranoid schizophrenia which started in his 30s.  Patient endorsed symptoms of disorganization, paranoia, auditory hallucinations, hypersomnia, decreased self-care, and slowed thought processes.  He denies any command hallucinations, visual hallucinations, symptoms of mania, or thoughts of self-harm/suicide.  [name] symptoms have been well controlled on medication in the past and he had been on long-acting injectable for several years.  While blood work from 2024 did show slightly elevated LDL has improved compared to 6 years prior.  Aims was completed and was within normal limits with no signs of tardive dyskinesia.    Gregory Jackson presents for follow-up evaluation. Today, 03/11/23, patient reports that his psychosis, delusions, paranoia, and AVH have been well-controlled.  He also denies disorganization though notes 1 night wandering around instead of sleeping at home as he had been locked out.  Patient had opted not to knock on the door and instead wandering around the streets.  Police found him and brought him home.  He continues to take his medication and denies any adverse side effects.  He was a couple days late for his last [place] injection.  We discussed the importance of taking the injection consistently and we will switch him to Eyvonne Mechanic for his next injection in September.  Risk  and benefits of this were reviewed.  Plan:  - Received Invega sustenna 234 mg initial dose on 6/13 - Received Invega Sustenna 156 mg on 8/13 - Start Invega Trinza 546 mg on September 9/13 - Continue Propranolol 10 mg BID - Continue cogentin 1 mg QD - Continue Trazodone 100 mg QHS - CBC, CMP, lipid profile, A1c, TSH reviewed - Crisis resources reviewed - Follow up in 2 to 3 months   Chief Complaint:  Chief Complaint  Patient presents with   Follow-up   HPI: [name] presents reporting things are going all right for the past 6 weeks.  He was a couple days late for his injection which she reports due to his bones hurting.  Otherwise he denies any side effects from the [place].  Patient reports that the disorganization is well-controlled and that his mood has its ups and downs.  He reports primarily he is just bored and can feel down during those times as there is nothing to do.  He spends a lot of his time walking around.  One night he did come home late and realized he had forgotten his keys.  Instead of knocking on the door to wake people up he left and walked around.  His family ended up calling the cops who eventually found him and brought him home.  We discussed this with Gregory Jackson today who listed his concern of waking up his family.  We reviewed that while that may not be ideal they still woke up and ended up calling the cops which was likely more disruptive than if  he had knocked on the door in the first place.  Patient was encouraged to knock on the door in the future if he was ever locked out.  We discussed the plan to switch back to [place] today.  Patient was agreeable with this plan.  He denied any adverse side effects with any other medications well.  Spoke with his brother and convey the plan with him.  [name] brother was in agreement and acknowledged that the incident where Gregory Jackson stayed out all night was the only concern over the past 6 weeks.  Past Psychiatric History:  Had followed with [facility] for his pyschiatric management in the past however has not been able to see them since the beginning of 2023. Was hospitalized 4 times between 2012 and 2013 for episodes of paranoid schizophrenia.  Patient denies any past suicide attempts.  Has taken Haldol, Zyprexa, and Risperdal Consta have been tried in the past.   Denies any alcohol, marijuana, or other substance use. Does smoke cigarretes and is on 2 packs a day. Increased and around 10 years ago when he lost his job.  Past Medical History:  Past Medical History:  Diagnosis Date   Asthma    Bradycardia 03/09/2013   Schizophrenia (HCC)    No past surgical history on file.  Family History:  Family History  Problem Relation Age of Onset   Heart disease Mother     Social History:  Social History   Socioeconomic History   Marital status: Single    Spouse name: Not on file   Number of children: Not on file   Years of education: Not on file   Highest education level: Not on file  Occupational History   Not on file  Tobacco Use   Smoking status: Every Day    Current packs/day: 1.00    Types: Cigarettes   Smokeless tobacco: Never  Substance and Sexual Activity   Alcohol use: No   Drug use: No   Sexual activity: Not Currently  Other Topics Concern   Not on file  Social History Narrative   Not on file   Social Determinants of Health   Financial Resource Strain: Low Risk  (10/01/2022)   Received from [facility], [facility]   Overall Financial Resource Strain (CARDIA)    Difficulty of Paying Living Expenses: Not very hard  Food Insecurity: No Food Insecurity (10/01/2022)   Received from [facility], [facility]   Hunger Vital Sign    Worried About Running Out of Food in the Last Year: Never true    Ran Out of Food in the Last Year: Never true  Transportation Needs: Unknown (10/01/2022)   Received from [facility], [facility]   PRAPARE - Transportation    Lack of Transportation  (Medical): No    Lack of Transportation (Non-Medical): Patient declined  Physical Activity: Inactive (10/01/2022)   Received from [facility], [facility]   Exercise Vital Sign    Days of Exercise per Week: 7 days    Minutes of Exercise per Session: 0 min  Stress: Stress Concern Present (10/01/2022)   Received from [facility], [facility]   [employer] of Occupational Health - Occupational Stress Questionnaire    Feeling of Stress : Rather much  Social Connections: Socially Isolated (10/01/2022)   Received from [facility], [facility]   Social Network    How would you rate your social network (family, work, friends)?: Little participation, lonely and socially isolated    Allergies:  Allergies  Allergen Reactions   Chocolate Rash   Milk-Related Compounds Rash    Current Medications: Current Outpatient Medications  Medication Sig Dispense Refill   Paliperidone Palmitate ER (INVEGA TRINZA) 546 MG/1.75ML injection Inject 1.75 mLs (546 mg total) into the muscle once for 1 dose. 1.75 mL 0   albuterol (VENTOLIN HFA) 108 (90 Base) MCG/ACT inhaler Inhale 2 puffs into the lungs every 4 (four) hours as needed for wheezing or shortness of breath. 18 g 1   benztropine (COGENTIN) 1 MG tablet Take 1 tablet (1 mg total) by mouth daily. 90 tablet 0   cetirizine (ZYRTEC) 10 MG tablet Take 1 tablet once a day or as needed for runny nose or itchy areas 30 tablet 5   fluticasone (FLONASE) 50 MCG/ACT nasal spray One spray once a day or as needed for stuffy nose 16 g 5   Fluticasone-Umeclidin-Vilant (TRELEGY ELLIPTA) 200-62.5-25 MCG/ACT AEPB USE 1 INHALATION DAILY 60 each 1   propranolol (INDERAL) 10 MG tablet Take 1 tablet (10 mg total) by mouth 2 (two) times daily. 180 tablet 0   traZODone (DESYREL) 100 MG tablet Take 1 tablet (100 mg total) by mouth at bedtime. 90 tablet 0   No current facility-administered medications for this visit.     Musculoskeletal: Strength & Muscle Tone:  within normal limits Gait & Station: normal Patient leans: N/A  Psychiatric Specialty Exam: Review of Systems  Blood pressure 120/74, pulse 80, height 6\' 5"  (1.956 m), weight 242 lb (109.8 kg).Body mass index is 28.7 kg/m.  General Appearance: Fairly Groomed  Eye Contact:  Fair  Speech:  Normal Rate  Volume:  Decreased  Mood:  Euthymic  Affect:  Congruent  Thought Process:  Coherent and Goal Directed  Orientation:  Full (Time, Place, and Person)  Thought Content: Logical   Suicidal Thoughts:  No  Homicidal Thoughts:  No  Memory:  Immediate;   Fair  Judgement:  Fair  Insight:  Fair  Psychomotor Activity:  Normal  Concentration:  Concentration: Good  Recall:  Good  Fund of Knowledge: Fair  Language: Good  Akathisia:  No    AIMS (if indicated): not done  Assets:  Communication Skills Desire for Improvement Housing Social Support  ADL's:  Intact  Cognition: WNL  Sleep:  Good   Metabolic Disorder Labs: No results found for: "HGBA1C", "MPG" No results found for: "PROLACTIN" Lab Results  Component Value Date   CHOL 175 08/29/2016   TRIG 158 (H) 08/29/2016   HDL 33 (L) 08/29/2016   CHOLHDL 5.3 (H) 08/29/2016   LDLCALC 110 (H) 08/29/2016   Lab Results  Component Value Date   TSH 1.290 03/09/2013   TSH 3.680 12/27/2011    Therapeutic Level Labs: No results found for: "LITHIUM" No results found for: "VALPROATE" No results found for: "CBMZ"   Screenings: GAD-7    Flowsheet Row [facility]  Total GAD-7 Score 7      PHQ2-9    Flowsheet Row Office Visit from 11/18/2022 in [facility] Office Visit from 01/01/2018 in [facility] Office Visit from 05/23/2017 in [facility] Office Visit from 08/29/2016 in [facility]  PHQ-2 Total Score 2 4 0 0  PHQ-9 Total  Score 7 12 -- --      Flowsheet Row Office Visit from 11/18/2022 in [facility] ED from 09/07/2022 in [facility] at [facility]  C-SSRS RISK CATEGORY No Risk No Risk       Collaboration of Care: Collaboration of Care: Medication Management AEB medication prescription  30 minutes were spent in chart review, interview, psycho education, counseling, medical decision making, coordination of care and long-term prognosis.  Patient was given opportunity to ask question and all concerns and questions were addressed and answers. Excluding separately billable services.   Patient/Guardian was advised Release of Information must be obtained prior to any record release in order to collaborate their care with an outside provider. Patient/Guardian was advised if they have not already done so to contact the registration department to sign all necessary forms in order for [place] to release information regarding their care.   Consent: Patient/Guardian gives verbal consent for treatment and assignment of benefits for services provided during this visit. Patient/Guardian expressed understanding and agreed to proceed.    Stasia Cavalier, MD 03/11/2023, 2:52 PM

## 2023-03-11 ENCOUNTER — Ambulatory Visit (HOSPITAL_COMMUNITY): Payer: 59 | Admitting: Psychiatry

## 2023-03-11 ENCOUNTER — Encounter (HOSPITAL_COMMUNITY): Payer: Self-pay | Admitting: Psychiatry

## 2023-03-11 VITALS — BP 120/74 | HR 80 | Ht 77.0 in | Wt 242.0 lb

## 2023-03-11 DIAGNOSIS — F200 Paranoid schizophrenia: Secondary | ICD-10-CM

## 2023-03-11 MED ORDER — TRAZODONE HCL 100 MG PO TABS: 100.0000 mg | ORAL_TABLET | Freq: Every day | ORAL | 0 refills | Status: DC

## 2023-03-11 MED ORDER — BENZTROPINE MESYLATE 1 MG PO TABS: 1.0000 mg | ORAL_TABLET | Freq: Every day | ORAL | 0 refills | Status: DC

## 2023-03-11 MED ORDER — PROPRANOLOL HCL 10 MG PO TABS
10.0000 mg | ORAL_TABLET | Freq: Two times a day (BID) | ORAL | 0 refills | Status: DC
Start: 2023-03-11 — End: 2023-06-03

## 2023-03-11 MED ORDER — INVEGA TRINZA 546 MG/1.75ML IM SUSY
546.0000 mg | PREFILLED_SYRINGE | Freq: Once | INTRAMUSCULAR | 0 refills | Status: DC
Start: 2023-03-11 — End: 2023-06-03

## 2023-03-25 ENCOUNTER — Encounter (HOSPITAL_COMMUNITY): Payer: Self-pay

## 2023-03-25 ENCOUNTER — Ambulatory Visit (HOSPITAL_COMMUNITY): Payer: 59

## 2023-03-26 ENCOUNTER — Ambulatory Visit (HOSPITAL_BASED_OUTPATIENT_CLINIC_OR_DEPARTMENT_OTHER): Payer: 59 | Admitting: *Deleted

## 2023-03-26 ENCOUNTER — Encounter (HOSPITAL_COMMUNITY): Payer: Self-pay | Admitting: *Deleted

## 2023-03-26 VITALS — BP 134/80 | HR 55 | Resp 18 | Ht 77.0 in | Wt 249.6 lb

## 2023-03-26 DIAGNOSIS — F200 Paranoid schizophrenia: Secondary | ICD-10-CM

## 2023-03-26 MED ORDER — PALIPERIDONE PALMITATE ER 546 MG/1.75ML IM SUSY
546.0000 mg | PREFILLED_SYRINGE | Freq: Once | INTRAMUSCULAR | Status: AC
Start: 2023-03-26 — End: 2023-03-26
  Administered 2023-03-26: 546 mg via INTRAMUSCULAR

## 2023-03-26 NOTE — Patient Instructions (Addendum)
Pt presents today for due Invega Trinza 546mg /1.67ml. this will be the first Trinza injection given here as pt has on [place] 156 previously. Pt appearance is disheveled but did not notice any body odor. Eye contact very, very, minimal  however pt is cooperative on approach. Pt again doesn't answer questions regarding AVH however at times to this writer he appears to be responding to internal stimuli. Thoughts are random, and somewhat tangential about his tennis shoes and staring into space mumbling and laughing to himself. Injection prepared as ordered and given in RD per pt request. Writer discussed importance of rotating injection sites. Pt says yes he understandings but does not appear to be actively listening. Pt is to return in approximately 3 months for next injection. Pt advised that he will need a refill for next injection. Pt is to let his brother know. Pt nods his head "yes". Pt denies SI,HI.

## 2023-03-28 ENCOUNTER — Ambulatory Visit (HOSPITAL_COMMUNITY): Payer: 59

## 2023-06-02 NOTE — Progress Notes (Unsigned)
BH MD/PA/NP OP Progress Note  06/03/2023 2:25 PM JANTZ SHADDY  MRN:  [id number]  Visit Diagnosis:    ICD-10-CM   1. Schizophrenia, paranoid type (HCC)  F20.0 benztropine (COGENTIN) 1 MG tablet    traZODone (DESYREL) 100 MG tablet    propranolol (INDERAL) 10 MG tablet    Paliperidone Palmitate ER (INVEGA TRINZA) 546 MG/1.75ML injection    DISCONTINUED: Paliperidone Palmitate ER (INVEGA TRINZA) 546 MG/1.75ML injection    2. Cigarette nicotine dependence with withdrawal  F17.213 nicotine ([employer] NICOTINE) 21 mg/24hr patch    nicotine (CVS NICOTINE TRANSDERMAL SYS) 14 mg/24hr patch    nicotine ([employer]S NICOTINE) 7 mg/24hr patch      Assessment: Gregory Jackson is a 47 y.o. male with a history of paranoid schizophrenia who presents to [facility] at [facility] for initial evaluation on 11/18/2022.    At initial evaluation patient reported a history of paranoid schizophrenia which started in his 30s.  Patient endorsed symptoms of disorganization, paranoia, auditory hallucinations, hypersomnia, decreased self-care, and slowed thought processes.  He denies any command hallucinations, visual hallucinations, symptoms of mania, or thoughts of self-harm/suicide.  [name] symptoms have been well controlled on medication in the past and he had been on long-acting injectable for several years.  While blood work from 2024 did show slightly elevated LDL has improved compared to 6 years prior.  Aims was completed and was within normal limits with no signs of tardive dyskinesia.    Gregory Jackson presents for follow-up evaluation. Today, 06/03/23, patient reports that he is doing well with no concerns of psychosis, delusions, or paranoia.  He received his last [place] Trinza injection on 9/11 and denied any adverse side effects other than some transient injection site pain.  This resolved after a week.  Continues to take the remainder of medication regimen without any adverse side effects.  Of  note patient is working on nicotine cessation.  He already has nicotine gum and we will start nicotine patches today.  Risk and benefits were reviewed.  We will continue remainder of current regimen and follow-up in 3 months.  Plan:  - Received [name] Trinza 546 mg dose on 9/11 - Had received multiple doses of Invega Sustenna 156 mg prior, last on 8/13 - Next Invega Trinza 546 mg on 12/11 - Continue Propranolol 10 mg BID - Continue cogentin 1 mg QD - Continue Trazodone 100 mg at bedtime - Start Nicotine patch 21 mg, taper by 7 mg every 28 days - CBC, CMP, lipid profile, A1c, TSH reviewed - Crisis resources reviewed - Follow up in 2 to 3 months  Chief Complaint:  Chief Complaint  Patient presents with   Follow-up   HPI: Gregory Jackson presents reporting that things have been alright.  He got his [place] injection back in September and reports that it is been working well.  He has not had any concerns about auditory hallucinations or delusions since receiving it.  Other than some injection site pain that lasted for about a week he denies any other adverse side effects.  Sleep has been around 4 to 5 hours a night.  Reports that he does drink 3 cups of coffee a day with the last one being in the afternoon.  We discussed cutting this down to 2 cups of coffee a day with a goal of eliminating the afternoon dose.  Patient notes that he is trying to quit smoking cigarettes due to the negative health effects.  Currently he is smoking  a pack per day he has some old nicotine gum and we discussed starting nicotine patches conjunction.  Patient was open to this and we reviewed the risk and benefits.  Of note he has been having some more difficulty with his sinuses lately which could potentially related to his nicotine use.  Past Psychiatric History: Had followed with [facility] for his pyschiatric management in the past however has not been able to see them since the beginning of 2023. Was hospitalized 4 times  between 2012 and 2013 for episodes of paranoid schizophrenia.  Patient denies any past suicide attempts.  Has taken Haldol, Zyprexa, and Risperdal Consta have been tried in the past.   Denies any alcohol, marijuana, or other substance use. Does smoke cigarretes and is on 2 packs a day. Increased and around 10 years ago when he lost his job.  Past Medical History:  Past Medical History:  Diagnosis Date   Asthma    Bradycardia 03/09/2013   Schizophrenia (HCC)    No past surgical history on file.  Family History:  Family History  Problem Relation Age of Onset   Heart disease Mother     Social History:  Social History   Socioeconomic History   Marital status: Single    Spouse name: Not on file   Number of children: Not on file   Years of education: Not on file   Highest education level: Not on file  Occupational History   Not on file  Tobacco Use   Smoking status: Every Day    Current packs/day: 1.00    Types: Cigarettes   Smokeless tobacco: Never  Substance and Sexual Activity   Alcohol use: No   Drug use: No   Sexual activity: Not Currently  Other Topics Concern   Not on file  Social History Narrative   Not on file   Social Determinants of Health   Financial Resource Strain: Low Risk  (10/01/2022)   Received from [facility], [facility]   Overall Financial Resource Strain (CARDIA)    Difficulty of Paying Living Expenses: Not very hard  Food Insecurity: No Food Insecurity (10/01/2022)   Received from [facility], [facility]   Hunger Vital Sign    Worried About Running Out of Food in the Last Year: Never true    Ran Out of Food in the Last Year: Never true  Transportation Needs: Unknown (10/01/2022)   Received from [facility], [facility]   PRAPARE - Transportation    Lack of Transportation (Medical): No    Lack of Transportation (Non-Medical): Patient declined  Physical Activity: Inactive (10/01/2022)   Received from [facility], [facility]    Exercise Vital Sign    Days of Exercise per Week: 7 days    Minutes of Exercise per Session: 0 min  Stress: Stress Concern Present (10/01/2022)   Received from [facility], [facility]   [employer] of Occupational Health - Occupational Stress Questionnaire    Feeling of Stress : Rather much  Social Connections: Socially Isolated (10/01/2022)   Received from [facility], [facility]   Social Network    How would you rate your social network (family, work, friends)?: Little participation, lonely and socially isolated    Allergies:  Allergies  Allergen Reactions   Chocolate Rash   Milk-Related Compounds Rash    Current Medications: Current Outpatient Medications  Medication Sig Dispense Refill   nicotine ([employer] NICOTINE TRANSDERMAL SYS) 14 mg/24hr patch Place 1 patch (14 mg total) onto the  skin daily. Start after finishing 21 mg patches 28 patch 0   nicotine ([employer] NICOTINE) 21 mg/24hr patch Place 1 patch (21 mg total) onto the skin daily. 28 patch 0   nicotine ([employer] NICOTINE) 7 mg/24hr patch Place 1 patch (7 mg total) onto the skin daily. Start after finishing 14 mg patches 28 patch 0   albuterol (VENTOLIN HFA) 108 (90 Base) MCG/ACT inhaler Inhale 2 puffs into the lungs every 4 (four) hours as needed for wheezing or shortness of breath. 18 g 1   benztropine (COGENTIN) 1 MG tablet Take 1 tablet (1 mg total) by mouth daily. 90 tablet 0   cetirizine (ZYRTEC) 10 MG tablet Take 1 tablet once a day or as needed for runny nose or itchy areas 30 tablet 5   fluticasone (FLONASE) 50 MCG/ACT nasal spray One spray once a day or as needed for stuffy nose 16 g 5   Fluticasone-Umeclidin-Vilant (TRELEGY ELLIPTA) 200-62.5-25 MCG/ACT AEPB USE 1 INHALATION DAILY 60 each 1   Paliperidone Palmitate ER (INVEGA TRINZA) 546 MG/1.75ML injection Inject 1.75 mLs (546 mg total) into the muscle once for 1 dose. 1.75 mL 2   propranolol (INDERAL) 10 MG tablet Take 1 tablet (10 mg total) by mouth 2 (two)  times daily. 180 tablet 0   traZODone (DESYREL) 100 MG tablet Take 1 tablet (100 mg total) by mouth at bedtime. 90 tablet 0   No current facility-administered medications for this visit.     Musculoskeletal: Strength & Muscle Tone: within normal limits Gait & Station: normal Patient leans: N/A  Psychiatric Specialty Exam: Review of Systems  Blood pressure (!) 141/85, pulse 71, height 6\' 4"  (1.93 m), weight 258 lb (117 kg).Body mass index is 31.4 kg/m.  General Appearance: Fairly Groomed  Eye Contact:  Fair  Speech:  Normal Rate  Volume:  Decreased  Mood:  Euthymic  Affect:  Congruent  Thought Process:  Coherent and Goal Directed  Orientation:  Full (Time, Place, and Person)  Thought Content: Logical   Suicidal Thoughts:  No  Homicidal Thoughts:  No  Memory:  Immediate;   Fair  Judgement:  Fair  Insight:  Fair  Psychomotor Activity:  Normal  Concentration:  Concentration: Good  Recall:  Good  Fund of Knowledge: Fair  Language: Good  Akathisia:  No    AIMS (if indicated): not done  Assets:  Communication Skills Desire for Improvement Housing Social Support  ADL's:  Intact  Cognition: WNL  Sleep:  Good   Metabolic Disorder Labs: No results found for: "HGBA1C", "MPG" No results found for: "PROLACTIN" Lab Results  Component Value Date   CHOL 175 08/29/2016   TRIG 158 (H) 08/29/2016   HDL 33 (L) 08/29/2016   CHOLHDL 5.3 (H) 08/29/2016   LDLCALC 110 (H) 08/29/2016   Lab Results  Component Value Date   TSH 1.290 03/09/2013   TSH 3.680 12/27/2011    Therapeutic Level Labs: No results found for: "LITHIUM" No results found for: "VALPROATE" No results found for: "CBMZ"   Screenings: GAD-7    Flowsheet Row [facility]18/20[facility]  Total GAD-7 Score 7      PHQ2-9    Flowsheet Row Office Visit from 11/18/2022 in [facility] Office Visit from 01/01/2018 in [facility] Office Visit from 05/23/2017 in [facility] Office Visit from 08/29/2016 in [facility]  PHQ-2 Total Score 2  4 0 0  PHQ-9 Total Score 7 12 -- --      Flowsheet Row Office Visit from 11/18/2022 in [facility] ED from 09/07/2022 in [facility] Emergency Department at [facility]  C-SSRS RISK CATEGORY No Risk No Risk       Collaboration of Care: Collaboration of Care: Medication Management AEB medication prescription  Patient/Guardian was advised Release of Information must be obtained prior to any record release in order to collaborate their care with an outside provider. Patient/Guardian was advised if they have not already done so to contact the registration department to sign all necessary forms in order for [place] to release information regarding their care.   Consent: Patient/Guardian gives verbal consent for treatment and assignment of benefits for services provided during this visit. Patient/Guardian expressed understanding and agreed to proceed.    Stasia Cavalier, MD 06/03/2023, 2:25 PM

## 2023-06-03 ENCOUNTER — Ambulatory Visit (HOSPITAL_BASED_OUTPATIENT_CLINIC_OR_DEPARTMENT_OTHER): Payer: 59 | Admitting: Psychiatry

## 2023-06-03 ENCOUNTER — Other Ambulatory Visit (HOSPITAL_COMMUNITY): Payer: Self-pay | Admitting: Psychiatry

## 2023-06-03 ENCOUNTER — Encounter (HOSPITAL_COMMUNITY): Payer: Self-pay | Admitting: Psychiatry

## 2023-06-03 VITALS — BP 141/85 | HR 71 | Ht 76.0 in | Wt 258.0 lb

## 2023-06-03 DIAGNOSIS — F17213 Nicotine dependence, cigarettes, with withdrawal: Secondary | ICD-10-CM

## 2023-06-03 DIAGNOSIS — F200 Paranoid schizophrenia: Secondary | ICD-10-CM

## 2023-06-03 MED ORDER — INVEGA TRINZA 546 MG/1.75ML IM SUSY
546.0000 mg | PREFILLED_SYRINGE | Freq: Once | INTRAMUSCULAR | 2 refills | Status: DC
Start: 2023-06-03 — End: 2023-06-03

## 2023-06-03 MED ORDER — TRAZODONE HCL 100 MG PO TABS
100.0000 mg | ORAL_TABLET | Freq: Every day | ORAL | 0 refills | Status: DC
Start: 2023-06-03 — End: 2023-08-26

## 2023-06-03 MED ORDER — NICOTINE 7 MG/24HR TD PT24: 7.0000 mg | MEDICATED_PATCH | Freq: Every day | TRANSDERMAL | 0 refills | Status: DC

## 2023-06-03 MED ORDER — PROPRANOLOL HCL 10 MG PO TABS
10.0000 mg | ORAL_TABLET | Freq: Two times a day (BID) | ORAL | 0 refills | Status: DC
Start: 2023-06-03 — End: 2023-08-26

## 2023-06-03 MED ORDER — NICOTINE 21 MG/24HR TD PT24
21.0000 mg | MEDICATED_PATCH | Freq: Every day | TRANSDERMAL | 0 refills | Status: DC
Start: 2023-06-03 — End: 2023-08-26

## 2023-06-03 MED ORDER — BENZTROPINE MESYLATE 1 MG PO TABS
1.0000 mg | ORAL_TABLET | Freq: Every day | ORAL | 0 refills | Status: DC
Start: 2023-06-03 — End: 2023-08-26

## 2023-06-03 MED ORDER — NICOTINE 14 MG/24HR TD PT24
14.0000 mg | MEDICATED_PATCH | Freq: Every day | TRANSDERMAL | 0 refills | Status: DC
Start: 2023-06-03 — End: 2023-08-26

## 2023-06-03 MED ORDER — INVEGA TRINZA 546 MG/1.75ML IM SUSY: 546.0000 mg | PREFILLED_SYRINGE | Freq: Once | INTRAMUSCULAR | 2 refills | Status: DC

## 2023-06-24 ENCOUNTER — Other Ambulatory Visit (HOSPITAL_COMMUNITY): Payer: Self-pay | Admitting: *Deleted

## 2023-06-24 DIAGNOSIS — F200 Paranoid schizophrenia: Secondary | ICD-10-CM

## 2023-06-24 MED ORDER — INVEGA TRINZA 546 MG/1.75ML IM SUSY: 546.0000 mg | PREFILLED_SYRINGE | Freq: Once | INTRAMUSCULAR | 0 refills | Status: DC

## 2023-06-25 ENCOUNTER — Ambulatory Visit (HOSPITAL_BASED_OUTPATIENT_CLINIC_OR_DEPARTMENT_OTHER): Payer: 59 | Admitting: *Deleted

## 2023-06-25 VITALS — BP 126/79 | HR 68 | Resp 20 | Ht 77.0 in | Wt 259.6 lb

## 2023-06-25 DIAGNOSIS — F200 Paranoid schizophrenia: Secondary | ICD-10-CM | POA: Diagnosis not present

## 2023-06-25 MED ORDER — PALIPERIDONE PALMITATE ER 546 MG/1.75ML IM SUSY
546.0000 mg | PREFILLED_SYRINGE | Freq: Once | INTRAMUSCULAR | Status: AC
  Administered 2023-06-25: 546 mg via INTRAMUSCULAR

## 2023-06-25 NOTE — Patient Instructions (Signed)
Pt presents to clinic today for due Invega Trinza 546 mg/1.75 ml. Pt is cooperative on approach but remains guarded about AVH. Eye contact a bit better today as well as hygiene. Pt says he has been able to decrease cigarette use to 1 PPD. Denies any issues with housing or food. However pt is mostly avoidant when asked specific questions. Pt does wring his hands and looks at them frequently. No tremors observed today. Injection prepared as ordered and given in LD without complaint. Pt scheduled to return in 3 months for next due injection.

## 2023-08-25 NOTE — Progress Notes (Signed)
BH MD/PA/NP OP Progress Note  08/26/2023 10:26 AM Gregory Jackson  MRN:  [id number]  Visit Diagnosis:    ICD-10-CM   1. Schizophrenia, paranoid type (HCC)  F20.0 Paliperidone Palmitate ER (INVEGA TRINZA) 546 MG/1.75ML injection    propranolol (INDERAL) 10 MG tablet    traZODone (DESYREL) 100 MG tablet    benztropine (COGENTIN) 1 MG tablet    2. Cigarette nicotine dependence with withdrawal  F17.213 nicotine ([facility] NICOTINE) 21 mg/24hr patch     Assessment: Gregory Jackson is a 48 y.o. male with a history of paranoid schizophrenia who presents to [facility] at [facility] for initial evaluation on 11/18/2022.    At initial evaluation patient reported a history of paranoid schizophrenia which started in his 30s.  Patient endorsed symptoms of disorganization, paranoia, auditory hallucinations, hypersomnia, decreased self-care, and slowed thought processes.  He denies any command hallucinations, visual hallucinations, symptoms of mania, or thoughts of self-harm/suicide.  [name] symptoms have been well controlled on medication in the past and he had been on long-acting injectable for several years.  While blood work from 2024 did show slightly elevated LDL has improved compared to 6 years prior.  Aims was completed and was within normal limits with no signs of tardive dyskinesia.    [name] presents for follow-up evaluation. Today, 08/26/23, patient reports that mood is stable and there has been no concerns of psychosis, delusions, or paranoia.  He received his last [place] Trinza injection on 12/11 and denied any adverse side effects other than some transient injection site pain.  Continues to take the remainder of medication regimen without any adverse side effects.  Of note patient is working on nicotine cessation.  He already has nicotine gum and tolerated the patch as well.  He has decreased his monthly switch from 5 cartons to 3.  We will continue on the nicotine  patches and the remainder of his current regimen and follow-up in 3 months.    Plan:  - Received [name] Trinza 546 mg dose on 12/11 - Had received multiple doses of Invega Sustenna 156 mg prior, last on 8/13 - Next Invega Trinza 546 mg on 3/11 - Continue Propranolol 10 mg BID - Continue cogentin 1 mg QD - Continue Trazodone 100 mg at bedtime - Continue Nicotine patch 21 mg, taper by 7 mg every 28 days - CBC, CMP, lipid profile, A1c, TSH reviewed - Crisis resources reviewed - Follow up in 3 months  Chief Complaint:  Chief Complaint  Patient presents with   Follow-up   HPI: Gregory Jackson presents reporting that things have been fine the past few months.  His mood has been fair with no concerns or depression and the hallucinations/delusions have been well controlled.  He has been tolerating the switch to [place] well.  Outside of the usual arm tenderness he denies any significant side effects.  Patient mentioned occasionally getting some hand tremors though notes that they are short lived.  He is taking the propranolol, Cogentin, and trazodone consistently which helps.  Patient reports that he is sleeping at night and eating well during the days.  He has been spending more time side with the cold weather and likes to play games and watch TV.  He is looking forward to the weather getting warmer so he can go out and walk again.  Smoking cessation has had some improvement as he is decreased from 5 cartons a month to 3.  He is using the nicotine patch with  good effect and denies any adverse side effects.   Past Psychiatric History: Had followed with [facility] for his pyschiatric management in the past however has not been able to see them since the beginning of 2023. Was hospitalized 4 times between 2012 and 2013 for episodes of paranoid schizophrenia.  Patient denies any past suicide attempts.  Has taken Haldol, Zyprexa, and Risperdal Consta have been tried in the past.   Denies any alcohol, marijuana,  or other substance use. Does smoke cigarretes and is on 2 packs a day. Increased and around 10 years ago when he lost his job.  Past Medical History:  Past Medical History:  Diagnosis Date   Asthma    Bradycardia 03/09/2013   Schizophrenia (HCC)    No past surgical history on file.  Family History:  Family History  Problem Relation Age of Onset   Heart disease Mother     Social History:  Social History   Socioeconomic History   Marital status: Single    Spouse name: Not on file   Number of children: Not on file   Years of education: Not on file   Highest education level: Not on file  Occupational History   Not on file  Tobacco Use   Smoking status: Every Day    Current packs/day: 1.00    Types: Cigarettes   Smokeless tobacco: Never  Substance and Sexual Activity   Alcohol use: No   Drug use: No   Sexual activity: Not Currently  Other Topics Concern   Not on file  Social History Narrative   Not on file   Social Drivers of Health   Financial Resource Strain: Low Risk  (10/01/2022)   Received from [facility], [facility]   Overall Financial Resource Strain (CARDIA)    Difficulty of Paying Living Expenses: Not very hard  Food Insecurity: No Food Insecurity (10/01/2022)   Received from [facility], [facility]   Hunger Vital Sign    Worried About Running Out of Food in the Last Year: Never true    Ran Out of Food in the Last Year: Never true  Transportation Needs: Unknown (10/01/2022)   Received from [facility], [facility]   PRAPARE - Transportation    Lack of Transportation (Medical): No    Lack of Transportation (Non-Medical): Patient declined  Physical Activity: Inactive (10/01/2022)   Received from [facility], [facility]   Exercise Vital Sign    Days of Exercise per Week: 7 days    Minutes of Exercise per Session: 0 min  Stress: Stress Concern Present (10/01/2022)   Received from [facility], [facility]   [employer] of  Occupational Health - Occupational Stress Questionnaire    Feeling of Stress : Rather much  Social Connections: Socially Isolated (10/01/2022)   Received from [facility], [facility]   Social Network    How would you rate your social network (family, work, friends)?: Little participation, lonely and socially isolated    Allergies:  Allergies  Allergen Reactions   Chocolate Rash   Milk-Related Compounds Rash    Current Medications: Current Outpatient Medications  Medication Sig Dispense Refill   albuterol (VENTOLIN HFA) 108 (90 Base) MCG/ACT inhaler Inhale 2 puffs into the lungs every 4 (four) hours as needed for wheezing or shortness of breath. 18 g 1   cetirizine (ZYRTEC) 10 MG tablet Take 1 tablet once a day or as needed for runny nose or itchy areas 30 tablet 5   fluticasone (FLONASE) 50  MCG/ACT nasal spray One spray once a day or as needed for stuffy nose 16 g 5   Fluticasone-Umeclidin-Vilant (TRELEGY ELLIPTA) 200-62.5-25 MCG/ACT AEPB USE 1 INHALATION DAILY 60 each 1   benztropine (COGENTIN) 1 MG tablet Take 1 tablet (1 mg total) by mouth daily. 90 tablet 0   nicotine ([employer] NICOTINE) 21 mg/24hr patch Place 1 patch (21 mg total) onto the skin daily. 28 patch 2   Paliperidone Palmitate ER (INVEGA TRINZA) 546 MG/1.75ML injection Inject 1.75 mLs (546 mg total) into the muscle once for 1 dose. 1.75 mL 0   propranolol (INDERAL) 10 MG tablet Take 1 tablet (10 mg total) by mouth 2 (two) times daily. 180 tablet 0   traZODone (DESYREL) 100 MG tablet Take 1 tablet (100 mg total) by mouth at bedtime. 90 tablet 0   No current facility-administered medications for this visit.     Musculoskeletal: Strength & Muscle Tone: within normal limits Gait & Station: normal Patient leans: N/A  Psychiatric Specialty Exam: Review of Systems  Blood pressure (!) 141/85, pulse 62, height 6\' 5"  (1.956 m), weight 267 lb (121.1 kg).Body mass index is 31.66 kg/m.  General Appearance: Fairly Groomed   Eye Contact:  Fair  Speech:  Normal Rate  Volume:  Decreased  Mood:  Euthymic  Affect:  Congruent  Thought Process:  Coherent and Goal Directed  Orientation:  Full (Time, Place, and Person)  Thought Content: Logical   Suicidal Thoughts:  No  Homicidal Thoughts:  No  Memory:  Immediate;   Fair  Judgement:  Fair  Insight:  Fair  Psychomotor Activity:  Normal  Concentration:  Concentration: Good  Recall:  Good  Fund of Knowledge: Fair  Language: Good  Akathisia:  No    AIMS (if indicated): not done  Assets:  Communication Skills Desire for Improvement Housing Social Support  ADL's:  Intact  Cognition: WNL  Sleep:  Good   Metabolic Disorder Labs: No results found for: "HGBA1C", "MPG" No results found for: "PROLACTIN" Lab Results  Component Value Date   CHOL 175 08/29/2016   TRIG 158 (H) 08/29/2016   HDL 33 (L) 08/29/2016   CHOLHDL 5.3 (H) 08/29/2016   LDLCALC 110 (H) 08/29/2016   Lab Results  Component Value Date   TSH 1.290 03/09/2013   TSH 3.680 12/27/2011    Therapeutic Level Labs: No results found for: "LITHIUM" No results found for: "VALPROATE" No results found for: "CBMZ"   Screenings: GAD-7    Flowsheet Row [facility]  Total GAD-7 Score 7      PHQ2-9    Flowsheet Row Office Visit from 11/18/2022 in [facility] Office Visit from 01/01/2018 in [facility] Office Visit from 05/23/2017 in [facility] Office Visit from 08/29/2016 in [facility]  PHQ-2 Total Score 2 4 0 0  PHQ-9 Total Score 7 12 -- --      Flowsheet Row Office Visit from 11/18/2022 in [facility] ED from 09/07/2022 in [facility] Emergency Department at [facility]  C-SSRS RISK CATEGORY No Risk No Risk       Collaboration of  Care: Collaboration of Care: Medication Management AEB medication prescription  Patient/Guardian was advised Release of Information must be obtained prior to any record release in order to collaborate their care with an outside provider. Patient/Guardian was advised if they have not already done  so to contact the registration department to sign all necessary forms in order for [place] to release information regarding their care.   Consent: Patient/Guardian gives verbal consent for treatment and assignment of benefits for services provided during this visit. Patient/Guardian expressed understanding and agreed to proceed.    Stasia Cavalier, MD 08/26/2023, 10:26 AM

## 2023-08-26 ENCOUNTER — Encounter (HOSPITAL_COMMUNITY): Payer: Self-pay | Admitting: Psychiatry

## 2023-08-26 ENCOUNTER — Ambulatory Visit (HOSPITAL_BASED_OUTPATIENT_CLINIC_OR_DEPARTMENT_OTHER): Payer: Medicare HMO | Admitting: Psychiatry

## 2023-08-26 VITALS — BP 141/85 | HR 62 | Ht 77.0 in | Wt 267.0 lb

## 2023-08-26 DIAGNOSIS — F17213 Nicotine dependence, cigarettes, with withdrawal: Secondary | ICD-10-CM

## 2023-08-26 DIAGNOSIS — F200 Paranoid schizophrenia: Secondary | ICD-10-CM | POA: Diagnosis not present

## 2023-08-26 MED ORDER — TRAZODONE HCL 100 MG PO TABS: 100.0000 mg | ORAL_TABLET | Freq: Every day | ORAL | 0 refills | Status: DC

## 2023-08-26 MED ORDER — BENZTROPINE MESYLATE 1 MG PO TABS: 1.0000 mg | ORAL_TABLET | Freq: Every day | ORAL | 0 refills | Status: DC

## 2023-08-26 MED ORDER — INVEGA TRINZA 546 MG/1.75ML IM SUSY: 546.0000 mg | PREFILLED_SYRINGE | Freq: Once | INTRAMUSCULAR | 0 refills | Status: DC

## 2023-08-26 MED ORDER — NICOTINE 21 MG/24HR TD PT24: 21.0000 mg | MEDICATED_PATCH | Freq: Every day | TRANSDERMAL | 2 refills | Status: DC

## 2023-08-26 MED ORDER — PROPRANOLOL HCL 10 MG PO TABS: 10.0000 mg | ORAL_TABLET | Freq: Two times a day (BID) | ORAL | 0 refills | Status: DC

## 2023-09-18 ENCOUNTER — Other Ambulatory Visit (HOSPITAL_COMMUNITY): Payer: Self-pay

## 2023-09-18 DIAGNOSIS — F200 Paranoid schizophrenia: Secondary | ICD-10-CM

## 2023-09-18 MED ORDER — INVEGA TRINZA 546 MG/1.75ML IM SUSY: 546.0000 mg | PREFILLED_SYRINGE | Freq: Once | INTRAMUSCULAR | 1 refills | Status: DC

## 2023-09-23 ENCOUNTER — Ambulatory Visit (HOSPITAL_COMMUNITY): Payer: 59

## 2023-09-25 ENCOUNTER — Other Ambulatory Visit: Payer: Self-pay

## 2023-09-25 ENCOUNTER — Ambulatory Visit (HOSPITAL_BASED_OUTPATIENT_CLINIC_OR_DEPARTMENT_OTHER)

## 2023-09-25 ENCOUNTER — Encounter (HOSPITAL_COMMUNITY): Payer: Self-pay

## 2023-09-25 VITALS — BP 129/84 | HR 52 | Ht 77.0 in | Wt 262.0 lb

## 2023-09-25 DIAGNOSIS — F200 Paranoid schizophrenia: Secondary | ICD-10-CM | POA: Diagnosis not present

## 2023-09-25 MED ORDER — PALIPERIDONE PALMITATE ER 546 MG/1.75ML IM SUSY
546.0000 mg | PREFILLED_SYRINGE | INTRAMUSCULAR | Status: DC
  Administered 2023-09-25 – 2023-12-25 (×2): 546 mg via INTRAMUSCULAR

## 2023-09-25 NOTE — Progress Notes (Signed)
 Patient arrives today for his due injection of Invega Trinza 546 mg/1.75 mL. Patient presents flat, well groomed with a provider from his home. Patient denies any SI/HI or AVH. Injection was prepared as ordered and administered in patients Right Deltoid. Patient tolerated well and without complaint. I noticed movement in the patients right hand that could be TD, however patient is very guarded when he is asked anything.      NDC: 16109-604-54 LOT: [id number] EXP: 2026-APR

## 2023-11-17 NOTE — Progress Notes (Deleted)
 BH MD/PA/NP OP Progress Note  11/17/2023 8:24 AM Gregory Jackson  MRN:  [id number]  Visit Diagnosis:  No diagnosis found.  Assessment: Gregory Jackson is a 48 y.o. male with a history of paranoid schizophrenia who presents to [facility] at [facility] for initial evaluation on 11/18/2022.    At initial evaluation patient reported a history of paranoid schizophrenia which started in his 30s.  Patient endorsed symptoms of disorganization, paranoia, auditory hallucinations, hypersomnia, decreased self-care, and slowed thought processes.  He denies any command hallucinations, visual hallucinations, symptoms of mania, or thoughts of self-harm/suicide.  [name] symptoms have been well controlled on medication in the past and he had been on long-acting injectable for several years.  While blood work from 2024 did show slightly elevated LDL has improved compared to 6 years prior.  Aims was completed and was within normal limits with no signs of tardive dyskinesia.    Gregory Jackson presents for follow-up evaluation. Today, 11/17/23, patient reports that    mood is stable and there has been no concerns of psychosis, delusions, or paranoia.  He received his last Invega  Trinza injection on 12/11 and denied any adverse side effects other than some transient injection site pain.  Continues to take the remainder of medication regimen without any adverse side effects.  Of note patient is working on nicotine  cessation.  He already has nicotine  gum and tolerated the patch as well.  He has decreased his monthly switch from 5 cartons to 3.  We will continue on the nicotine  patches and the remainder of his current regimen and follow-up in 3 months.    Plan:  - Received Invega  Trinza 546 mg dose on 09/25/23 - Had received multiple doses of Invega  Sustenna 156 mg prior, last on 8/13 - Next Invega  Trinza 546 mg on 6/12 - Continue Propranolol  10 mg BID - Continue cogentin  1 mg QD - Continue  Trazodone  100 mg at bedtime - Continue Nicotine  patch 21 mg, taper by 7 mg every 28 days - CBC, CMP, lipid profile, A1c, TSH reviewed - Crisis resources reviewed - Follow up in 3 months  Chief Complaint:  No chief complaint on file.  HPI: Gregory Jackson presents reporting that    things have been fine the past few months.  His mood has been fair with no concerns or depression and the hallucinations/delusions have been well controlled.  He has been tolerating the switch to Invega  Trinza well.  Outside of the usual arm tenderness he denies any significant side effects.  Patient mentioned occasionally getting some hand tremors though notes that they are short lived.  He is taking the propranolol , Cogentin , and trazodone  consistently which helps.  Patient reports that he is sleeping at night and eating well during the days.  He has been spending more time side with the cold weather and likes to play games and watch TV.  He is looking forward to the weather getting warmer so he can go out and walk again.  Smoking cessation has had some improvement as he is decreased from 5 cartons a month to 3.  He is using the nicotine  patch with good effect and denies any adverse side effects.   Past Psychiatric History: Had followed with [facility] for his pyschiatric management in the past however has not been able to see them since the beginning of 2023. Was hospitalized 4 times between 2012 and 2013 for episodes of paranoid schizophrenia.  Patient denies any past suicide attempts.  Has taken  Haldol , Zyprexa , and Risperdal Consta have been tried in the past.   Denies any alcohol, marijuana, or other substance use. Does smoke cigarretes and is on 2 packs a day. Increased and around 10 years ago when he lost his job.  Past Medical History:  Past Medical History:  Diagnosis Date   Asthma    Bradycardia 03/09/2013   Schizophrenia (HCC)    No past surgical history on file.  Family History:  Family History  Problem  Relation Age of Onset   Heart disease Mother     Social History:  Social History   Socioeconomic History   Marital status: Single    Spouse name: Not on file   Number of children: Not on file   Years of education: Not on file   Highest education level: Not on file  Occupational History   Not on file  Tobacco Use   Smoking status: Every Day    Current packs/day: 1.00    Types: Cigarettes   Smokeless tobacco: Never  Substance and Sexual Activity   Alcohol use: No   Drug use: No   Sexual activity: Not Currently  Other Topics Concern   Not on file  Social History Narrative   Not on file   Social Drivers of Health   Financial Resource Strain: Low Risk  (10/01/2022)   [name]   Overall Financial Resource Strain (CARDIA)    Difficulty of Paying Living Expenses: Not very hard  Food Insecurity: No Food Insecurity (10/01/2022)   Received from [facility], [facility]   Hunger Vital Sign    Worried About Running Out of Food in the Last Year: Never true    Ran Out of Food in the Last Year: Never true  Transportation Needs: Unknown (10/01/2022)   Received from [facility], [facility]   PRAPARE - Transportation    Lack of Transportation (Medical): No    Lack of Transportation (Non-Medical): Patient declined  Physical Activity: Inactive (10/01/2022)   Received from [facility], [facility]   Exercise Vital Sign    Days of Exercise per Week: 7 days    Minutes of Exercise per Session: 0 min  Stress: Stress Concern Present (10/01/2022)   Received from [facility], [facility]   [employer] of Occupational Health - Occupational Stress Questionnaire    Feeling of Stress : Rather much  Social Connections: Socially Isolated (10/01/2022)   Received from [facility], [facility]   Social Network    How would you rate your social network (family, work, friends)?: Little participation, lonely and socially isolated    Allergies:   Allergies  Allergen Reactions   Chocolate Rash   Milk-Related Compounds Rash    Current Medications: Current Outpatient Medications  Medication Sig Dispense Refill   albuterol  (VENTOLIN  HFA) 108 (90 Base) MCG/ACT inhaler Inhale 2 puffs into the lungs every 4 (four) hours as needed for wheezing or shortness of breath. 18 g 1   benztropine  (COGENTIN ) 1 MG tablet Take 1 tablet (1 mg total) by mouth daily. 90 tablet 0   cetirizine  (ZYRTEC ) 10 MG tablet Take 1 tablet once a day or as needed for runny nose or itchy areas 30 tablet 5   fluticasone  (FLONASE ) 50 MCG/ACT nasal spray One spray once a day or as needed for stuffy nose 16 g 5   Fluticasone -Umeclidin-Vilant (TRELEGY ELLIPTA ) 200-62.5-25 MCG/ACT AEPB USE 1 INHALATION DAILY 60 each 1   nicotine  ([employer] NICOTINE ) 21 mg/24hr patch Place  1 patch (21 mg total) onto the skin daily. 28 patch 2   Paliperidone  Palmitate ER (INVEGA  TRINZA) 546 MG/1.75ML injection Inject 1.75 mLs (546 mg total) into the muscle once for 1 dose. 1.75 mL 1   propranolol  (INDERAL ) 10 MG tablet Take 1 tablet (10 mg total) by mouth 2 (two) times daily. 180 tablet 0   traZODone  (DESYREL ) 100 MG tablet Take 1 tablet (100 mg total) by mouth at bedtime. 90 tablet 0   Current Facility-Administered Medications  Medication Dose Route Frequency Provider Last Rate Last Admin   Paliperidone  Palmitate ER (INVEGA  TRINZA) injection 546 mg  546 mg Intramuscular Q90 days Yves Herb, MD   546 mg at 09/25/23 1323     Musculoskeletal: Strength & Muscle Tone: within normal limits Gait & Station: normal Patient leans: N/A  Psychiatric Specialty Exam: Review of Systems  There were no vitals taken for this visit.There is no height or weight on file to calculate BMI.  General Appearance: Fairly Groomed  Eye Contact:  Fair  Speech:  Normal Rate  Volume:  Decreased  Mood:  Euthymic  Affect:  Congruent  Thought Process:  Coherent and Goal Directed  Orientation:  Full (Time, Place,  and Person)  Thought Content: Logical   Suicidal Thoughts:  No  Homicidal Thoughts:  No  Memory:  Immediate;   Fair  Judgement:  Fair  Insight:  Fair  Psychomotor Activity:  Normal  Concentration:  Concentration: Good  Recall:  Good  Fund of Knowledge: Fair  Language: Good  Akathisia:  No    AIMS (if indicated): not done  Assets:  Communication Skills Desire for Improvement Housing Social Support  ADL's:  Intact  Cognition: WNL  Sleep:  Good   Metabolic Disorder Labs: No results found for: "HGBA1C", "MPG" No results found for: "PROLACTIN" Lab Results  Component Value Date   CHOL 175 08/29/2016   TRIG 158 (H) 08/29/2016   HDL 33 (L) 08/29/2016   CHOLHDL 5.3 (H) 08/29/2016   LDLCALC 110 (H) 08/29/2016   Lab Results  Component Value Date   TSH 1.290 03/09/2013   TSH 3.680 12/27/2011    Therapeutic Level Labs: No results found for: "LITHIUM" No results found for: "VALPROATE" No results found for: "CBMZ"   Screenings: GAD-7    Flowsheet Row Office Visit from 11/18/2022 in [facility]  Total GAD-7 Score 7      PHQ2-9    Flowsheet Row Office Visit from 11/18/2022 in [facility] Office Visit from 01/01/2018 in [facility] Office Visit from 05/23/2017 in [facility] Office Visit from 08/29/2016 in [facility]  PHQ-2 Total Score 2 4 0 0  PHQ-9 Total Score 7 12 -- --      Flowsheet Row Office Visit from 11/18/2022 in [facility] from 09/07/2022 in [facility] Emergency Department at [facility]  C-SSRS RISK CATEGORY No Risk No Risk       Collaboration of Care: Collaboration of Care: Medication Management AEB medication prescription  Patient/Guardian was advised Release of Information must be obtained prior to any record release in order  to collaborate their care with an outside provider. Patient/Guardian was advised if they have not already done so to contact the registration department to sign all necessary forms in order for us  to release information regarding their care.   Consent: Patient/Guardian gives verbal consent for treatment  and assignment of benefits for services provided during this visit. Patient/Guardian expressed understanding and agreed to proceed.    Yves Herb, MD 11/17/2023, 8:24 AM

## 2023-11-18 ENCOUNTER — Ambulatory Visit (HOSPITAL_COMMUNITY): Payer: Medicare HMO | Admitting: Psychiatry

## 2023-11-19 ENCOUNTER — Ambulatory Visit (HOSPITAL_COMMUNITY): Admitting: Psychiatry

## 2023-11-19 ENCOUNTER — Other Ambulatory Visit: Payer: Self-pay

## 2023-11-19 ENCOUNTER — Encounter (HOSPITAL_COMMUNITY): Payer: Self-pay | Admitting: Psychiatry

## 2023-11-19 DIAGNOSIS — F17213 Nicotine dependence, cigarettes, with withdrawal: Secondary | ICD-10-CM

## 2023-11-19 DIAGNOSIS — F200 Paranoid schizophrenia: Secondary | ICD-10-CM | POA: Diagnosis not present

## 2023-11-19 MED ORDER — NICOTINE 21 MG/24HR TD PT24: 21.0000 mg | MEDICATED_PATCH | Freq: Every day | TRANSDERMAL | 2 refills | Status: DC

## 2023-11-19 MED ORDER — TRAZODONE HCL 100 MG PO TABS: 100.0000 mg | ORAL_TABLET | Freq: Every day | ORAL | 0 refills | Status: DC

## 2023-11-19 MED ORDER — INVEGA TRINZA 546 MG/1.75ML IM SUSY: 546.0000 mg | PREFILLED_SYRINGE | Freq: Once | INTRAMUSCULAR | 1 refills | Status: DC

## 2023-11-19 MED ORDER — PROPRANOLOL HCL 10 MG PO TABS: 10.0000 mg | ORAL_TABLET | Freq: Two times a day (BID) | ORAL | 0 refills | Status: DC

## 2023-11-19 MED ORDER — BENZTROPINE MESYLATE 1 MG PO TABS: 1.0000 mg | ORAL_TABLET | Freq: Every day | ORAL | 0 refills | Status: DC

## 2023-11-19 NOTE — Progress Notes (Signed)
 BH MD/PA/NP OP Progress Note  11/19/2023 9:44 AM Gregory Jackson  MRN:  [id number]  Visit Diagnosis:    ICD-10-CM   1. Schizophrenia, paranoid type (HCC)  F20.0 benztropine  (COGENTIN ) 1 MG tablet    traZODone  (DESYREL ) 100 MG tablet    propranolol  (INDERAL ) 10 MG tablet    Paliperidone  Palmitate ER (INVEGA  TRINZA) 546 MG/1.75ML injection    2. Cigarette nicotine  dependence with withdrawal  F17.213 nicotine  ([employer] NICOTINE ) 21 mg/24hr patch      Assessment: Gregory Jackson is a 48 y.o. male with a history of paranoid schizophrenia who presents to [facility] at [facility] for initial evaluation on 11/18/2022.    At initial evaluation patient reported a history of paranoid schizophrenia which started in his 30s.  Patient endorsed symptoms of disorganization, paranoia, auditory hallucinations, hypersomnia, decreased self-care, and slowed thought processes.  He denies any command hallucinations, visual hallucinations, symptoms of mania, or thoughts of self-harm/suicide.  [name] symptoms have been well controlled on medication in the past and he had been on long-acting injectable for several years.  While blood work from 2024 did show slightly elevated LDL has improved compared to 6 years prior.  Aims was completed and was within normal limits with no signs of tardive dyskinesia.    Gregory Jackson presents for follow-up evaluation. Today, 11/19/23, patient reports that mood is stable and there has been no concerns of psychosis, delusions, or paranoia.  He received his last Invega  Trinza injection on 3/13 and his next due on 6/12.  He continues to take the remainder of medication regimen without any adverse side effects.  Of note patient has not cut back on his nicotine  use though is using nicotine  patches daily per his report.  He is still using around 3 cartons a month. We will continue on the nicotine  patches and the remainder of his current regimen and follow-up in 3 months.     Plan:  - Received Invega  Trinza 546 mg dose on 09/25/23 - Next Invega  Trinza 546 mg on 6/12 - Continue Propranolol  10 mg BID - Continue cogentin  1 mg QD - Continue Trazodone  100 mg at bedtime - Continue Nicotine  patch 21 mg, taper by 7 mg every 28 days - CBC, CMP, lipid profile, A1c, TSH reviewed - Crisis resources reviewed - Follow up in 3 months  Chief Complaint:  Chief Complaint  Patient presents with   Follow-up   HPI: Gregory Jackson presents reporting that things have been about the same over the past 3 months.  He got his last injection in March and has reported no issues since then.  Gregory Jackson has been primarily staying at home watching TV as it is too hot outside. He will occasionally goes out in the night, when it is cooler to walk around.  Gregory Jackson reports that he is home by bedtime and will not stay out all night.  He reports that he is sleeping fairly well at night though his sleep can be interrupted.  Patient denies any auditory or visual hallucinations, delusions, or paranoia.  He does have some negative symptoms of schizophrenia including decreased self-care, fatigue, and amotivation.  Discussed the importance of behavioral activation.  Patient is still smoking cigarettes daily reports intention to stop.  He does report that he has been using nicotine  patches.  Patient denies any movement concerns and aims was 0 on exam today.  He has been having some pain with his teeth and was recommended to make an appointment with his dentist.  Past Psychiatric History: Had followed with [facility] for his pyschiatric management in the past however has not been able to see them since the beginning of 2023. Was hospitalized 4 times between 2012 and 2013 for episodes of paranoid schizophrenia.  Patient denies any past suicide attempts.  Has taken Haldol , Zyprexa , and Risperdal Consta have been tried in the past.   Denies any alcohol, marijuana, or other substance use. Does smoke cigarretes and is on 2 packs  a day. Increased and around 10 years ago when he lost his job.  Past Medical History:  Past Medical History:  Diagnosis Date   Asthma    Bradycardia 03/09/2013   Schizophrenia (HCC)    No past surgical history on file.  Family History:  Family History  Problem Relation Age of Onset   Heart disease Mother     Social History:  Social History   Socioeconomic History   Marital status: Single    Spouse name: Not on file   Number of children: Not on file   Years of education: Not on file   Highest education level: Not on file  Occupational History   Not on file  Tobacco Use   Smoking status: Every Day    Current packs/day: 1.00    Types: Cigarettes   Smokeless tobacco: Never  Substance and Sexual Activity   Alcohol use: No   Drug use: No   Sexual activity: Not Currently  Other Topics Concern   Not on file  Social History Narrative   Not on file   Social Drivers of Health   Financial Resource Strain: Low Risk  (11/17/2023)   Received from [facility]   Overall Financial Resource Strain (CARDIA)    Difficulty of Paying Living Expenses: Not hard at all  Food Insecurity: No Food Insecurity (11/17/2023)   Received from [facility]   Hunger Vital Sign    Worried About Running Out of Food in the Last Year: Never true    Ran Out of Food in the Last Year: Never true  Transportation Needs: No Transportation Needs (11/17/2023)   Received from [facility] - Transportation    Lack of Transportation (Medical): No    Lack of Transportation (Non-Medical): No  Physical Activity: Unknown (11/17/2023)   Received from [facility]   Exercise Vital Sign    Days of Exercise per Week: 0 days    Minutes of Exercise per Session: Not on file  Stress: Stress Concern Present (11/17/2023)   Received from [facility] - Occupational Stress Questionnaire    Feeling of Stress : Very much  Social Connections: Socially Isolated (11/17/2023)    Received from [facility]   Social Network    How would you rate your social network (family, work, friends)?: Little participation, lonely and socially isolated    Allergies:  Allergies  Allergen Reactions   Chocolate Rash   Milk-Related Compounds Rash    Current Medications: Current Outpatient Medications  Medication Sig Dispense Refill   albuterol  (VENTOLIN  HFA) 108 (90 Base) MCG/ACT inhaler Inhale 2 puffs into the lungs every 4 (four) hours as needed for wheezing or shortness of breath. 18 g 1   cetirizine  (ZYRTEC ) 10 MG tablet Take 1 tablet once a day or as needed for runny nose or itchy areas 30 tablet 5   fluticasone  (FLONASE ) 50 MCG/ACT nasal spray One spray once a day or as needed for stuffy nose 16 g 5  Fluticasone -Umeclidin-Vilant (TRELEGY ELLIPTA ) 200-62.5-25 MCG/ACT AEPB USE 1 INHALATION DAILY 60 each 1   benztropine  (COGENTIN ) 1 MG tablet Take 1 tablet (1 mg total) by mouth daily. 90 tablet 0   nicotine  ([employer] NICOTINE ) 21 mg/24hr patch Place 1 patch (21 mg total) onto the skin daily. 28 patch 2   Paliperidone  Palmitate ER (INVEGA  TRINZA) 546 MG/1.75ML injection Inject 1.75 mLs (546 mg total) into the muscle once for 1 dose. 1.75 mL 1   propranolol  (INDERAL ) 10 MG tablet Take 1 tablet (10 mg total) by mouth 2 (two) times daily. 180 tablet 0   traZODone  (DESYREL ) 100 MG tablet Take 1 tablet (100 mg total) by mouth at bedtime. 90 tablet 0   Current Facility-Administered Medications  Medication Dose Route Frequency Provider Last Rate Last Admin   Paliperidone  Palmitate ER (INVEGA  TRINZA) injection 546 mg  546 mg Intramuscular Q90 days Yves Herb, MD   546 mg at 09/25/23 1323     Musculoskeletal: Strength & Muscle Tone: within normal limits Gait & Station: normal Patient leans: N/A  Psychiatric Specialty Exam: Review of Systems  Blood pressure 124/84, pulse (!) 57, height 6\' 5"  (1.956 m), weight 269 lb (122 kg).Body mass index is 31.9 kg/m.  General  Appearance: Fairly Groomed  Eye Contact:  Fair  Speech:  Normal Rate  Volume:  Decreased  Mood:  Euthymic  Affect:  Congruent  Thought Process:  Coherent and Goal Directed  Orientation:  Full (Time, Place, and Person)  Thought Content: Logical   Suicidal Thoughts:  No  Homicidal Thoughts:  No  Memory:  Immediate;   Fair  Judgement:  Fair  Insight:  Fair  Psychomotor Activity:  Normal  Concentration:  Concentration: Good  Recall:  Good  Fund of Knowledge: Fair  Language: Good  Akathisia:  No    AIMS (if indicated): not done  Assets:  Communication Skills Desire for Improvement Housing Social Support  ADL's:  Intact  Cognition: WNL  Sleep:  Good   Metabolic Disorder Labs: No results found for: "HGBA1C", "MPG" No results found for: "PROLACTIN" Lab Results  Component Value Date   CHOL 175 08/29/2016   TRIG 158 (H) 08/29/2016   HDL 33 (L) 08/29/2016   CHOLHDL 5.3 (H) 08/29/2016   LDLCALC 110 (H) 08/29/2016   Lab Results  Component Value Date   TSH 1.290 03/09/2013   TSH 3.680 12/27/2011    Therapeutic Level Labs: No results found for: "LITHIUM" No results found for: "VALPROATE" No results found for: "CBMZ"   Screenings: AIMS    Flowsheet Row [facility]  AIMS Total Score 0      GAD-7    Flowsheet Row Office Visit from 11/18/2022 in [facility]  Total GAD-7 Score 7      PHQ2-9    Flowsheet Row Office Visit from 11/18/2022 in [facility] Office Visit from 01/01/2018 in [facility] Office Visit from 05/23/2017 in [facility] Office Visit from 08/29/2016 in [facility]  PHQ-2 Total Score 2 4 0 0  PHQ-9 Total Score 7 12 -- --      Flowsheet Row [facility]20[facility] from 09/07/2022 in [facility] Emergency Department at [facility]  C-SSRS RISK CATEGORY No Risk No Risk       Collaboration of Care: Collaboration of  Care: Medication Management AEB medication prescription  Patient/Guardian was advised Release of Information must be obtained prior to any record release in order to collaborate their care with an outside provider. Patient/Guardian was advised if they have not already done so to contact the registration department to sign all necessary forms in order for us  to release information regarding their care.   Consent: Patient/Guardian gives verbal consent for treatment and assignment of benefits for services provided during this visit. Patient/Guardian expressed understanding and agreed to proceed.    Yves Herb, MD 11/19/2023, 9:44 AM

## 2023-12-25 ENCOUNTER — Encounter (HOSPITAL_COMMUNITY): Payer: Self-pay | Admitting: *Deleted

## 2023-12-25 ENCOUNTER — Ambulatory Visit (HOSPITAL_BASED_OUTPATIENT_CLINIC_OR_DEPARTMENT_OTHER): Admitting: *Deleted

## 2023-12-25 VITALS — BP 138/88 | HR 72 | Resp 18 | Ht 77.0 in | Wt 268.8 lb

## 2023-12-25 DIAGNOSIS — F200 Paranoid schizophrenia: Secondary | ICD-10-CM | POA: Diagnosis not present

## 2023-12-25 NOTE — Progress Notes (Signed)
 Pt presents today for due LAI Invega  Trinza 546mg /1.84ml. Pt affect flat but did brighten a bit when talking with Clinical research associate. Eye contact minimal. Pt says he has been sleeping a lot during the day sounding as though boredom is the factor. Pt guarded initially when asked about AVH but did endorse VH and some AH as well. Pt did not directly deny that AH are command in nature. Pt only c/o is that he needs to see a dentist. Injection prepared as ordered and given in LD without complaint. Pt is to return in 3 months for next due injection.

## 2024-02-16 NOTE — Progress Notes (Unsigned)
 BH MD/PA/NP OP Progress Note  02/17/2024 1:46 PM Gregory Jackson  MRN:  [id number]  Visit Diagnosis:    ICD-10-CM   1. Schizophrenia, paranoid type (HCC)  F20.0 benztropine  (COGENTIN ) 1 MG tablet    propranolol  (INDERAL ) 10 MG tablet    traZODone  (DESYREL ) 100 MG tablet    Paliperidone  Palmitate ER (INVEGA  TRINZA) 546 MG/1.75ML injection    2. Cigarette nicotine  dependence with withdrawal  F17.213 nicotine  ([employer] NICOTINE ) 21 mg/24hr patch      Assessment: Gregory Jackson is a 48 y.o. male with a history of paranoid schizophrenia who presents to [facility] for initial evaluation on 11/18/2022.    At initial evaluation patient reported a history of paranoid schizophrenia which started in his 30s.  Patient endorsed symptoms of disorganization, paranoia, auditory hallucinations, hypersomnia, decreased self-care, and slowed thought processes.  He denies any command hallucinations, visual hallucinations, symptoms of mania, or thoughts of self-harm/suicide.  [name] symptoms have been well controlled on medication in the past and he had been on long-acting injectable for several years.  While blood work from 2024 did show slightly elevated LDL has improved compared to 6 years prior.  Aims was completed and was within normal limits with no signs of tardive dyskinesia.    Alm LITTIE Ober presents for follow-up evaluation. Today, 02/17/24, patient reports that his mood has been stable with no concerns of psychosis, delusions, or paranoia.  He received his last Invega  Trinza injection on 6/12 and his next due on 9/11.  He continues to take the remainder of medication regimen without any adverse side effects.  Of note patient has started to cut back on his nicotine  use though is using nicotine  patches daily per his report.  He is smoking roughly 1 pack every day and a half though can go 12 to 24 hours without smoking. We will continue on the nicotine  patches and the  remainder of his current regimen and follow-up in 3 months.    Psychotherapeutic interventions were used during today's session. From 1:32 PM to 1:48 PM. Therapeutic interventions included empathic listening, supportive therapy, cognitive and behavioral therapy, motivational interviewing.  Worked on cognitive reframing techniques and unhelpful thoughts challenged as appropriate. Alternative thoughts developed with guidance. Reviewed some techniques to facilitate increased behavioral activation. Improvement was evidenced by patient's participation and identified commitment to therapy goals.   Plan:  - Received Invega  Trinza 546 mg dose on 12/25/23 - Next Invega  Trinza 546 mg on 9/11 - Continue Propranolol  10 mg BID - Continue cogentin  1 mg QD - Continue Trazodone  100 mg at bedtime - Continue Nicotine  patch 21 mg, taper by 7 mg every 28 days - CBC, CMP, lipid profile, A1c, TSH reviewed - Crisis resources reviewed - Follow up in 3 months  Chief Complaint:  Chief Complaint  Patient presents with   Follow-up   HPI: Gregory Jackson presents reporting that he doing alright.  His psychosis has been well controlled with the injectable and he reports the only adverse side effect is the injection site soreness for a few days.  He denies any movement concerns noting that they are well controlled with propranolol  and Cogentin  he takes.  In regards to smoking cessation he is making some progress.  He can occasionally go 12 to 24 hours without smoking.  When he does however he tends to binge and is smoking roughly 1 pack every day and a half currently.  Reviewed reasons for smoking and part of the reason patient  reports his boredom.  When he used to work he was not smoking as frequently and it was not until he went on disability.  Brainstormed some alternative behavioral activation ideas to help with inactivity and boredom during the day.  Volunteering is one recommendation the patient mentioned that there is a food  pantry nearby the need to help.  His main concern today is an ongoing dental issue.  He reports that he is having tooth decay and been trying to get into the dentist to get it pulled.  Is not overly painful but it can have intermittent flareups.  Reviewed how smoking can to worsening dental hygiene.  Still trying to get into the dentist as his tooth has been having issues  Past Psychiatric History: Had followed with [facility] for his pyschiatric management in the past however has not been able to see them since the beginning of 2023. Was hospitalized 4 times between 2012 and 2013 for episodes of paranoid schizophrenia.  Patient denies any past suicide attempts.  Has taken Haldol , Zyprexa , and Risperdal Consta have been tried in the past.   Denies any alcohol, marijuana, or other substance use. Does smoke cigarretes and is on 2 packs a day. Increased and around 10 years ago when he lost his job.  Past Medical History:  Past Medical History:  Diagnosis Date   Asthma    Bradycardia 03/09/2013   Schizophrenia (HCC)    History reviewed. No pertinent surgical history.  Family History:  Family History  Problem Relation Age of Onset   Heart disease Mother     Social History:  Social History   Socioeconomic History   Marital status: Single    Spouse name: Not on file   Number of children: Not on file   Years of education: Not on file   Highest education level: Not on file  Occupational History   Not on file  Tobacco Use   Smoking status: Every Day    Current packs/day: 1.00    Types: Cigarettes   Smokeless tobacco: Never  Substance and Sexual Activity   Alcohol use: No   Drug use: No   Sexual activity: Not Currently  Other Topics Concern   Not on file  Social History Narrative   Not on file   Social Drivers of Health   Financial Resource Strain: Low Risk  (11/17/2023)   Received from [facility]   Overall Financial Resource Strain (CARDIA)    Difficulty of Paying Living  Expenses: Not hard at all  Food Insecurity: No Food Insecurity (11/17/2023)   Received [facility]   Hunger Vital Sign    Within the past 12 months, you worried that your food would run out before you got the money to buy more.: Never true    Within the past 12 months, the food you bought just didn't last and you didn't have money to get more.: Never true  Transportation Needs: No Transportation Needs (11/17/2023)   Received from [facility] - Transportation    Lack of Transportation (Medical): No    Lack of Transportation (Non-Medical): No  Physical Activity: Unknown (11/17/2023)   Received from [facility]   Exercise Vital Sign    On average, how many days per week do you engage in moderate to strenuous exercise (like a brisk walk)?: 0 days    Minutes of Exercise per Session: Not on file  Stress: Stress Concern Present (11/17/2023)   Received from [facility]  [facility] - Occupational Stress Questionnaire    Feeling of Stress : Very much  Social Connections: Socially Isolated (11/17/2023)   Received from [facility]   Social Network    How would you rate your social network (family, work, friends)?: Little participation, lonely and socially isolated    Allergies:  Allergies  Allergen Reactions   Chocolate Rash   Milk-Related Compounds Rash    Current Medications: Current Outpatient Medications  Medication Sig Dispense Refill   albuterol  (VENTOLIN  HFA) 108 (90 Base) MCG/ACT inhaler Inhale 2 puffs into the lungs every 4 (four) hours as needed for wheezing or shortness of breath. 18 g 1   cetirizine  (ZYRTEC ) 10 MG tablet Take 1 tablet once a day or as needed for runny nose or itchy areas 30 tablet 5   fluticasone  (FLONASE ) 50 MCG/ACT nasal spray One spray once a day or as needed for stuffy nose 16 g 5   Fluticasone -Umeclidin-Vilant (TRELEGY ELLIPTA ) 200-62.5-25 MCG/ACT AEPB USE 1 INHALATION DAILY 60 each 1   benztropine  (COGENTIN )  1 MG tablet Take 1 tablet (1 mg total) by mouth daily. 90 tablet 0   nicotine  ([employer] NICOTINE ) 21 mg/24hr patch Place 1 patch (21 mg total) onto the skin daily. 28 patch 2   Paliperidone  Palmitate ER (INVEGA  TRINZA) 546 MG/1.75ML injection Inject 1.75 mLs (546 mg total) into the muscle once for 1 dose. 1.75 mL 1   propranolol  (INDERAL ) 10 MG tablet Take 1 tablet (10 mg total) by mouth 2 (two) times daily. 180 tablet 0   traZODone  (DESYREL ) 100 MG tablet Take 1 tablet (100 mg total) by mouth at bedtime. 90 tablet 0   Current Facility-Administered Medications  Medication Dose Route Frequency Provider Last Rate Last Admin   Paliperidone  Palmitate ER (INVEGA  TRINZA) injection 546 mg  546 mg Intramuscular Q90 days Carvin Arvella HERO, MD   546 mg at 12/25/23 1312     Musculoskeletal: Strength & Muscle Tone: within normal limits Gait & Station: normal Patient leans: N/A  Psychiatric Specialty Exam: Review of Systems  Blood pressure 133/88, pulse 80, height 6' 5 (1.956 m), weight 270 lb (122.5 kg).Body mass index is 32.02 kg/m.  General Appearance: Fairly Groomed  Eye Contact:  Fair  Speech:  Normal Rate  Volume:  Decreased  Mood:  Euthymic  Affect:  Congruent  Thought Process:  Coherent and Goal Directed  Orientation:  Full (Time, Place, and Person)  Thought Content: Logical   Suicidal Thoughts:  No  Homicidal Thoughts:  No  Memory:  Immediate;   Fair  Judgement:  Fair  Insight:  Fair  Psychomotor Activity:  Normal  Concentration:  Concentration: Good  Recall:  Good  Fund of Knowledge: Fair  Language: Good  Akathisia:  No    AIMS (if indicated): not done  Assets:  Communication Skills Desire for Improvement Housing Social Support  ADL's:  Intact  Cognition: WNL  Sleep:  Good   Metabolic Disorder Labs: No results found for: HGBA1C, MPG No results found for: PROLACTIN Lab Results  Component Value Date   CHOL 175 08/29/2016   TRIG 158 (H) 08/29/2016   HDL 33 (L)  08/29/2016   CHOLHDL 5.3 (H) 08/29/2016   LDLCALC 110 (H) 08/29/2016   Lab Results  Component Value Date   TSH 1.290 03/09/2013   TSH 3.680 12/27/2011    Therapeutic Level Labs: No results found for: LITHIUM No results found for: VALPROATE No results found for: CBMZ   Screenings: AIMS  Flowsheet Row [facility]  AIMS Total Score 0   GAD-7    Flowsheet Row Office Visit from 11/18/2022 in [facility]  Total GAD-7 Score 7   PHQ2-9    Flowsheet Row Office Visit from 11/18/2022 in [facility] Office Visit from 01/01/2018 in [facility] Office Visit from 05/23/2017 in [facility] Office Visit from 08/29/2016 in [name]  PHQ-2 Total Score 2 4 0 0  PHQ-9 Total Score 7 12 -- --   Flowsheet Row Office Visit from 11/18/2022 in [facility] from 09/07/2022 in [facility] Emergency Department at [facility]  C-SSRS RISK CATEGORY No Risk No Risk    Collaboration of Care: Collaboration of Care: Medication Management AEB medication prescription  Patient/Guardian was advised Release of Information must be obtained prior to any record release in order to collaborate their care with an outside provider. Patient/Guardian was advised if they have not already done so to contact the registration department to sign all necessary forms in order for us  to release information regarding their care.   Consent: Patient/Guardian gives verbal consent for treatment and assignment of benefits for services provided during this visit. Patient/Guardian expressed understanding and agreed to proceed.    Arvella CHRISTELLA Finder, MD 02/17/2024, 1:46 PM

## 2024-02-17 ENCOUNTER — Encounter (HOSPITAL_COMMUNITY): Payer: Self-pay | Admitting: Psychiatry

## 2024-02-17 ENCOUNTER — Other Ambulatory Visit: Payer: Self-pay

## 2024-02-17 ENCOUNTER — Ambulatory Visit (HOSPITAL_COMMUNITY): Admitting: Psychiatry

## 2024-02-17 DIAGNOSIS — F200 Paranoid schizophrenia: Secondary | ICD-10-CM | POA: Diagnosis not present

## 2024-02-17 DIAGNOSIS — F17213 Nicotine dependence, cigarettes, with withdrawal: Secondary | ICD-10-CM

## 2024-02-17 MED ORDER — BENZTROPINE MESYLATE 1 MG PO TABS: 1.0000 mg | ORAL_TABLET | Freq: Every day | ORAL | 0 refills | Status: DC

## 2024-02-17 MED ORDER — PROPRANOLOL HCL 10 MG PO TABS
10.0000 mg | ORAL_TABLET | Freq: Two times a day (BID) | ORAL | 0 refills | Status: DC
Start: 2024-02-17 — End: 2024-05-11

## 2024-02-17 MED ORDER — INVEGA TRINZA 546 MG/1.75ML IM SUSY: 546.0000 mg | PREFILLED_SYRINGE | Freq: Once | INTRAMUSCULAR | 1 refills | Status: DC

## 2024-02-17 MED ORDER — NICOTINE 21 MG/24HR TD PT24: 21.0000 mg | MEDICATED_PATCH | Freq: Every day | TRANSDERMAL | 2 refills | Status: AC

## 2024-02-17 MED ORDER — TRAZODONE HCL 100 MG PO TABS: 100.0000 mg | ORAL_TABLET | Freq: Every day | ORAL | 0 refills | Status: DC

## 2024-03-25 ENCOUNTER — Ambulatory Visit (HOSPITAL_BASED_OUTPATIENT_CLINIC_OR_DEPARTMENT_OTHER): Admitting: *Deleted

## 2024-03-25 VITALS — BP 138/82 | HR 76 | Resp 16 | Ht 77.0 in | Wt 263.0 lb

## 2024-03-25 DIAGNOSIS — F200 Paranoid schizophrenia: Secondary | ICD-10-CM

## 2024-03-25 DIAGNOSIS — F201 Disorganized schizophrenia: Secondary | ICD-10-CM

## 2024-03-25 MED ORDER — PALIPERIDONE PALMITATE ER 546 MG/1.75ML IM SUSY
546.0000 mg | PREFILLED_SYRINGE | Freq: Once | INTRAMUSCULAR | Status: AC
  Administered 2024-03-25: 546 mg via INTRAMUSCULAR

## 2024-03-25 NOTE — Patient Instructions (Addendum)
 Pt in clinic today for due Invega  Trinza 546 mg/1.75 ml. Pt presents today as flat, anxious, with no eye contact. Pt appears depressed and almost as if disoriented at times. Pt was mumbling and had a seemingly difficult time answering questions. Pt was responding to internal stimuli throughout visit and also endorsed AH; not command in nature he says. Pt denies SI, HI. Injection prepared as ordered and given in RD without complaint. Pt to return in 30 days for next due injection.

## 2024-05-10 NOTE — Progress Notes (Unsigned)
 BH MD/PA/NP OP Progress Note  05/11/2024 1:39 PM ZAVIER CANELA  MRN:  [id number]  Visit Diagnosis:    ICD-10-CM   1. Schizophrenia, paranoid type (HCC)  F20.0 benztropine  (COGENTIN ) 1 MG tablet    propranolol  (INDERAL ) 10 MG tablet    traZODone  (DESYREL ) 100 MG tablet    Paliperidone  Palmitate ER (INVEGA  TRINZA) 546 MG/1.75ML injection    2. Cigarette nicotine  dependence with withdrawal  F17.213        Assessment: ZAYDEN MAFFEI is a 48 y.o. male with a history of paranoid schizophrenia who presents to [facility] for initial evaluation on 11/18/2022.    At initial evaluation patient reported a history of paranoid schizophrenia which started in his 30s.  Patient endorsed symptoms of disorganization, paranoia, auditory hallucinations, hypersomnia, decreased self-care, and slowed thought processes.  He denies any command hallucinations, visual hallucinations, symptoms of mania, or thoughts of self-harm/suicide.  [name] symptoms have been well controlled on medication in the past and he had been on long-acting injectable for several years.  While blood work from 2024 did show slightly elevated LDL has improved compared to 6 years prior.  Aims was completed and was within normal limits with no signs of tardive dyskinesia.    Alm LITTIE Ober presents for follow-up evaluation. Today, 05/11/24, patient reports that mood symptoms been stable and there has been no recurrence of psychosis, delusions, paranoia, or disorganization.  He remains compliant with his injection receiving his last on 9/11.  Next is due on 12/11.  Continue take his medications without adverse side effects.  Aims was completed today and patient scored 0.  Cigarette use has decreased slightly and patient has recently started buccal pouches.  These have helped to cut down on his cigarette use.  Discussed ultimate goal of also getting off of the buccal pouches.  Plan:  - Received Invega  Trinza  546 mg dose on 03/25/24 - Next Invega  Trinza 546 mg on 12/11 - Continue Propranolol  10 mg BID - Continue cogentin  1 mg QD - Continue Trazodone  100 mg at bedtime - AIMS score of 0, completed 05/11/2024 - CBC, CMP, lipid profile, A1c, TSH reviewed  -Repeat March 2026 - Crisis resources reviewed - Follow up in 3 months  Chief Complaint:  Chief Complaint  Patient presents with   Follow-up   HPI: Toren presents reporting that he is doing alright, has been dealing with a lot of coughing lately.  This is been going on for a month or 2 now.  He reports the sputum is clear when it does come up.  He was encouraged to reach out to his PCP to explore this.  Patient is still smoking cigarettes but reports that the usage has gotten a bit better. He has smoked 6 today in addition to using nicotine  pouches. He is starting to switch over to that from the cigarettes. He is hopeful that this transition will allow him to quit smoking. He is hopeful that a week off the cigarettes will be enough to prevent him from going back.  He did have a period of cessation in the past for roughly 9 days before restarting.  Patient reports mood symptoms have been stable without any concerns.  He also denies any recurrence of psychosis.  He can still go out and walk for a few hours during the day but is always returning before the night.  He has not had any instances of spending the night outside again.  Patient is enjoying  [place]ing [place] currently.  Aims was completed today which was 0.  Patient did report some biting on the inside of his gums but this has a longstanding thing he does to past the time.  He reports of being a conscious endeavor.  He also denies any significant changes in this behavior over several years.  He is still taking his medications consistently and denies adverse side effects.  Past Psychiatric History: Had followed with [facility] for his pyschiatric management in the past however has  not been able to see them since the beginning of 2023. Was hospitalized 4 times between 2012 and 2013 for episodes of paranoid schizophrenia.  Patient denies any past suicide attempts.  Has taken Haldol , Zyprexa , and Risperdal Consta have been tried in the past.   Denies any alcohol, marijuana, or other substance use. Does smoke cigarretes and is on 2 packs a day. Increased and around 10 years ago when he lost his job.  Past Medical History:  Past Medical History:  Diagnosis Date   Asthma    Bradycardia 03/09/2013   Schizophrenia (HCC)    History reviewed. No pertinent surgical history.  Family History:  Family History  Problem Relation Age of Onset   Heart disease Mother     Social History:  Social History   Socioeconomic History   Marital status: Single    Spouse name: Not on file   Number of children: Not on file   Years of education: Not on file   Highest education level: Not on file  Occupational History   Not on file  Tobacco Use   Smoking status: Every Day    Current packs/day: 1.00    Types: Cigarettes   Smokeless tobacco: Never  Substance and Sexual Activity   Alcohol use: No   Drug use: No   Sexual activity: Not Currently  Other Topics Concern   Not on file  Social History Narrative   Not on file   Social Drivers of Health   Financial Resource Strain: Low Risk  (11/17/2023)   Received from [facility]   Overall Financial Resource Strain (CARDIA)    Difficulty of Paying Living Expenses: Not hard at all  Food Insecurity: No Food Insecurity (11/17/2023)   Received from [facility]   Hunger Vital Sign    Within the past 12 months, you worried that your food would run out before you got the money to buy more.: Never true    Within the past 12 months, the food you bought just didn't last and you didn't have money to get more.: Never true  Transportation Needs: No Transportation Needs (11/17/2023)   Received from [facility] - [facility]     Lack of Transportation (Medical): No    Lack of Transportation (Non-Medical): No  Physical Activity: Unknown (11/17/2023)   Received from [facility]   Exercise Vital Sign    On average, how many days per week do you engage in moderate to strenuous exercise (like a brisk walk)?: 0 days    Minutes of Exercise per Session: Not on file  Stress: Stress Concern Present (11/17/2023)   Received from [facility] - Occupational Stress Questionnaire    Feeling of Stress : Very much  Social Connections: Socially Isolated (11/17/2023)   Received from [facility]   Social Network    How would you rate your social network (family, work, friends)?: Little participation, lonely and socially isolated  Allergies:  Allergies  Allergen Reactions   Chocolate Rash   Milk-Related Compounds Rash    Current Medications: Current Outpatient Medications  Medication Sig Dispense Refill   albuterol  (VENTOLIN  HFA) 108 (90 Base) MCG/ACT inhaler Inhale 2 puffs into the lungs every 4 (four) hours as needed for wheezing or shortness of breath. 18 g 1   benztropine  (COGENTIN ) 1 MG tablet Take 1 tablet (1 mg total) by mouth daily. 90 tablet 0   cetirizine  (ZYRTEC ) 10 MG tablet Take 1 tablet once a day or as needed for runny nose or itchy areas 30 tablet 5   fluticasone  (FLONASE ) 50 MCG/ACT nasal spray One spray once a day or as needed for stuffy nose 16 g 5   Fluticasone -Umeclidin-Vilant (TRELEGY ELLIPTA ) 200-62.5-25 MCG/ACT AEPB USE 1 INHALATION DAILY 60 each 1   nicotine  ([employer] NICOTINE ) 21 mg/24hr patch Place 1 patch (21 mg total) onto the skin daily. 28 patch 2   Paliperidone  Palmitate ER (INVEGA  TRINZA) 546 MG/1.75ML injection Inject 1.75 mLs (546 mg total) into the muscle once for 1 dose. 1.75 mL 1   propranolol  (INDERAL ) 10 MG tablet Take 1 tablet (10 mg total) by mouth 2 (two) times daily. 180 tablet 0   traZODone  (DESYREL ) 100 MG tablet Take 1 tablet (100 mg total)  by mouth at bedtime. 90 tablet 0   Current Facility-Administered Medications  Medication Dose Route Frequency Provider Last Rate Last Admin   Paliperidone  Palmitate ER (INVEGA  TRINZA) injection 546 mg  546 mg Intramuscular Q90 days Carvin Arvella HERO, MD   546 mg at 12/25/23 1312     Musculoskeletal: Strength & Muscle Tone: within normal limits Gait & Station: normal Patient leans: N/A  Psychiatric Specialty Exam: Review of Systems  Blood pressure 119/76, pulse 61, height 6' 4 (1.93 m), weight 269 lb (122 kg).Body mass index is 32.74 kg/m.  General Appearance: Fairly Groomed  Eye Contact:  Fair  Speech:  Normal Rate  Volume:  Decreased  Mood:  Euthymic  Affect:  Congruent  Thought Process:  Coherent and Goal Directed  Orientation:  Full (Time, Place, and Person)  Thought Content: Logical   Suicidal Thoughts:  No  Homicidal Thoughts:  No  Memory:  Immediate;   Fair  Judgement:  Fair  Insight:  Fair  Psychomotor Activity:  Normal  Concentration:  Concentration: Good  Recall:  Good  Fund of Knowledge: Fair  Language: Good  Akathisia:  No    AIMS (if indicated): 0  Assets:  Communication Skills Desire for Improvement Housing Social Support  ADL's:  Intact  Cognition: WNL  Sleep:  Good   Metabolic Disorder Labs: No results found for: HGBA1C, MPG No results found for: PROLACTIN Lab Results  Component Value Date   CHOL 175 08/29/2016   TRIG 158 (H) 08/29/2016   HDL 33 (L) 08/29/2016   CHOLHDL 5.3 (H) 08/29/2016   LDLCALC 110 (H) 08/29/2016   Lab Results  Component Value Date   TSH 1.290 03/09/2013   TSH 3.680 12/27/2011    Therapeutic Level Labs: No results found for: LITHIUM No results found for: VALPROATE No results found for: CBMZ   Screenings: AIMS    Flowsheet Row [facility] Office Visit from 11/19/2023 in [facility]  AIMS  Total Score 0 0   GAD-7    Flowsheet Row Office Visit from 11/18/2022 in [facility]  Total GAD-7 Score 7   PHQ2-9  Flowsheet Row Office Visit from 11/18/2022 in [facility] Office Visit from 01/01/2018 in [facility] Office Visit from 05/23/2017 in [facility] Office Visit from 08/29/2016 in [facility]  PHQ-2 Total Score 2 4 0 0  PHQ-9 Total Score 7 12 -- --   Flowsheet Row Office Visit from 11/18/2022 in [facility] ED from 09/07/2022 in [facility] at [facility]  C-SSRS RISK CATEGORY No Risk No Risk    Collaboration of Care: Collaboration of Care: Medication Management AEB medication prescription  Patient/Guardian was advised Release of Information must be obtained prior to any record release in order to collaborate their care with an outside provider. Patient/Guardian was advised if they have not already done so to contact the registration department to sign all necessary forms in order for us  to release information regarding their care.   Consent: Patient/Guardian gives verbal consent for treatment and assignment of benefits for services provided during this visit. Patient/Guardian expressed understanding and agreed to proceed.    Arvella CHRISTELLA Finder, MD 05/11/2024, 1:39 PM

## 2024-05-11 ENCOUNTER — Ambulatory Visit (HOSPITAL_COMMUNITY): Admitting: Psychiatry

## 2024-05-11 ENCOUNTER — Encounter (HOSPITAL_COMMUNITY): Payer: Self-pay | Admitting: Psychiatry

## 2024-05-11 VITALS — BP 119/76 | HR 61 | Ht 76.0 in | Wt 269.0 lb

## 2024-05-11 DIAGNOSIS — F200 Paranoid schizophrenia: Secondary | ICD-10-CM

## 2024-05-11 DIAGNOSIS — F17213 Nicotine dependence, cigarettes, with withdrawal: Secondary | ICD-10-CM

## 2024-05-11 MED ORDER — TRAZODONE HCL 100 MG PO TABS: 100.0000 mg | ORAL_TABLET | Freq: Every day | ORAL | 0 refills | Status: AC

## 2024-05-11 MED ORDER — PROPRANOLOL HCL 10 MG PO TABS
10.0000 mg | ORAL_TABLET | Freq: Two times a day (BID) | ORAL | 0 refills | Status: AC
Start: 2024-05-11 — End: ?

## 2024-05-11 MED ORDER — BENZTROPINE MESYLATE 1 MG PO TABS: 1.0000 mg | ORAL_TABLET | Freq: Every day | ORAL | 0 refills | Status: AC

## 2024-05-11 MED ORDER — INVEGA TRINZA 546 MG/1.75ML IM SUSY: 546.0000 mg | PREFILLED_SYRINGE | Freq: Once | INTRAMUSCULAR | 1 refills | Status: DC

## 2024-05-18 ENCOUNTER — Ambulatory Visit (HOSPITAL_COMMUNITY): Admitting: Psychiatry

## 2024-06-16 ENCOUNTER — Other Ambulatory Visit (HOSPITAL_COMMUNITY): Payer: Self-pay | Admitting: *Deleted

## 2024-06-16 ENCOUNTER — Other Ambulatory Visit (HOSPITAL_COMMUNITY): Payer: Self-pay | Admitting: Psychiatry

## 2024-06-16 DIAGNOSIS — F200 Paranoid schizophrenia: Secondary | ICD-10-CM

## 2024-06-16 MED ORDER — INVEGA TRINZA 546 MG/1.75ML IM SUSY: 546.0000 mg | PREFILLED_SYRINGE | Freq: Once | INTRAMUSCULAR | 1 refills | Status: DC

## 2024-06-16 MED ORDER — INVEGA TRINZA 546 MG/1.75ML IM SUSY: 546.0000 mg | PREFILLED_SYRINGE | Freq: Once | INTRAMUSCULAR | 1 refills | Status: AC

## 2024-06-24 ENCOUNTER — Other Ambulatory Visit: Payer: Self-pay

## 2024-06-24 ENCOUNTER — Ambulatory Visit (HOSPITAL_COMMUNITY): Admitting: *Deleted

## 2024-06-24 VITALS — BP 166/94 | HR 66 | Ht 77.0 in | Wt 274.0 lb

## 2024-06-24 DIAGNOSIS — F200 Paranoid schizophrenia: Secondary | ICD-10-CM

## 2024-06-24 MED ORDER — PALIPERIDONE PALMITATE ER 546 MG/1.75ML IM SUSY
546.0000 mg | PREFILLED_SYRINGE | INTRAMUSCULAR | Status: AC
  Administered 2024-06-24: 546 mg via INTRAMUSCULAR

## 2024-06-24 NOTE — Progress Notes (Signed)
 Pt in clinic today for due Invega  Trinza 546mg /1.75 ml. Pt denies SI/HI/AVH. Patient tolerated injection in LD well and without complaint. Injection was prepared as ordered and under supervision of Clive Mayer, CMA.   NDC:50458-608-01 ONU:[id number] EXP:[id number]     This CMA observed the injection, patient left awake and ambulatory

## 2024-08-09 NOTE — Progress Notes (Unsigned)
 BH MD/PA/NP OP Progress Note  08/09/2024 10:56 AM Gregory Jackson  MRN:  [id number]  Visit Diagnosis:  No diagnosis found.  Assessment: Gregory Jackson is a 49 y.o. male with a history of paranoid schizophrenia who presents to [facility] for initial evaluation on 11/18/2022.    At initial evaluation patient reported a history of paranoid schizophrenia which started in his 30s.  Patient endorsed symptoms of disorganization, paranoia, auditory hallucinations, hypersomnia, decreased self-care, and slowed thought processes.  He denies any command hallucinations, visual hallucinations, symptoms of mania, or thoughts of self-harm/suicide.  [name] symptoms have been well controlled on medication in the past and he had been on long-acting injectable for several years.  While blood work from 2024 did show slightly elevated LDL has improved compared to 6 years prior.  Aims was completed and was within normal limits with no signs of tardive dyskinesia.    Gregory Jackson presents for follow-up evaluation. Today, 08/09/24, patient    that mood symptoms been stable and there has been no recurrence of psychosis, delusions, paranoia, or disorganization.  He remains compliant with his injection receiving his last on 9/11.  Next is due on 12/11.  Continue take his medications without adverse side effects.  Aims was completed today and patient scored 0.  Cigarette use has decreased slightly and patient has recently started buccal pouches.  These have helped to cut down on his cigarette use.  Discussed ultimate goal of also getting off of the buccal pouches.  Plan:  - Received Invega  Trinza 546 mg dose on 06/24/24 - Next Invega  Trinza 546 mg on 09/22/24 - Continue Propranolol  10 mg BID - Continue cogentin  1 mg QD - Continue Trazodone  100 mg at bedtime - AIMS score of 0, completed 05/11/2024 - CBC, CMP, lipid profile, A1c, TSH reviewed  -Repeat March 2026 - Crisis resources  reviewed - Follow up in 3 months  Chief Complaint:  No chief complaint on file.  HPI: Gregory Jackson presents reporting that     he is doing alright, has been dealing with a lot of coughing lately.  This is been going on for a month or 2 now.  He reports the sputum is clear when it does come up.  He was encouraged to reach out to his PCP to explore this.  Patient is still smoking cigarettes but reports that the usage has gotten a bit better. He has smoked 6 today in addition to using nicotine  pouches. He is starting to switch over to that from the cigarettes. He is hopeful that this transition will allow him to quit smoking. He is hopeful that a week off the cigarettes will be enough to prevent him from going back.  He did have a period of cessation in the past for roughly 9 days before restarting.  Patient reports mood symptoms have been stable without any concerns.  He also denies any recurrence of psychosis.  He can still go out and walk for a few hours during the day but is always returning before the night.  He has not had any instances of spending the night outside again.  Patient is enjoying [place] currently.  Aims was completed today which was 0.  Patient did report some biting on the inside of his gums but this has a longstanding thing he does to past the time.  He reports of being a conscious endeavor.  He also denies any significant changes in this behavior  over several years.  He is still taking his medications consistently and denies adverse side effects.  Past Psychiatric History: Had followed with [facility] for his pyschiatric management in the past however has not been able to see them since the beginning of 2023. Was hospitalized 4 times between 2012 and 2013 for episodes of paranoid schizophrenia.  Patient denies any past suicide attempts.  Has taken Haldol , Zyprexa , and Risperdal Consta have been tried in the past.   Denies any alcohol, marijuana, or other  substance use. Does smoke cigarretes and is on 2 packs a day. Increased and around 10 years ago when he lost his job.  Past Medical History:  Past Medical History:  Diagnosis Date   Asthma    Bradycardia 03/09/2013   Schizophrenia (HCC)    No past surgical history on file.  Family History:  Family History  Problem Relation Age of Onset   Heart disease Mother     Social History:  Social History   Socioeconomic History   Marital status: Single    Spouse name: Not on file   Number of children: Not on file   Years of education: Not on file   Highest education level: Not on file  Occupational History   Not on file  Tobacco Use   Smoking status: Every Day    Current packs/day: 1.00    Types: Cigarettes   Smokeless tobacco: Never  Substance and Sexual Activity   Alcohol use: No   Drug use: No   Sexual activity: Not Currently  Other Topics Concern   Not on file  Social History Narrative   Not on file   Social Drivers of Health   Tobacco Use: High Risk (07/28/2024)   Received from [facility]   Patient History    Smoking Tobacco Use: Every Day    Smokeless Tobacco Use: Never    Passive Exposure: Current  Financial Resource Strain: Low Risk (07/28/2024)   Received from [facility]   Overall Financial Resource Strain (CARDIA)    How hard is it for you to pay for the very basics like food, housing, medical care, and heating?: Not hard at all  Food Insecurity: No Food Insecurity (07/28/2024)   Received from [facility]   Epic    Within the past 12 months, you worried that your food would run out before you got the money to buy more.: Never true    Within the past 12 months, the food you bought just didn't last and you didn't have money to get more.: Never true  Transportation Needs: No Transportation Needs (07/28/2024)   Received from [facility]    In the past 12 months, has lack of transportation kept you from medical appointments or from getting  medications?: No    In the past 12 months, has lack of transportation kept you from meetings, work, or from getting things needed for daily living?: No  Physical Activity: Unknown (11/17/2023)   Received from [facility]   Exercise Vital Sign    On average, how many days per week do you engage in moderate to strenuous exercise (like a brisk walk)?: 0 days    Minutes of Exercise per Session: Not on file  Stress: Stress Concern Present (11/17/2023)   Received from [facility] - Occupational Stress Questionnaire    Feeling of Stress : Very much  Social Connections: Socially Isolated (11/17/2023)   Received from [facility]  Social Network    How would you rate your social network (family, work, friends)?: Little participation, lonely and socially isolated  Depression (PHQ2-9): Medium Risk (11/18/2022)   Depression (PHQ2-9)    PHQ-2 Score: 7  Alcohol Screen: Not on file  Housing: Low Risk (07/28/2024)   [facility]    In the last 12 months, was there a time when you were not able to pay the mortgage or rent on time?: No    In the past 12 months, how many times have you moved where you were living?: 0    At any time in the past 12 months, were you homeless or living in a shelter (including now)?: No  Utilities: Not At Risk (07/28/2024)   Received from [facility]    In the past 12 months has the electric, gas, oil, or water company threatened to shut off services in your home?: No  Health Literacy: Not on file    Allergies:  Allergies  Allergen Reactions   Chocolate Rash   Milk-Related Compounds Rash    Current Medications: Current Outpatient Medications  Medication Sig Dispense Refill   albuterol  (VENTOLIN  HFA) 108 (90 Base) MCG/ACT inhaler Inhale 2 puffs into the lungs every 4 (four) hours as needed for wheezing or shortness of breath. 18 g 1   benztropine  (COGENTIN ) 1 MG tablet Take 1 tablet (1 mg total)  by mouth daily. 90 tablet 0   cetirizine  (ZYRTEC ) 10 MG tablet Take 1 tablet once a day or as needed for runny nose or itchy areas 30 tablet 5   fluticasone  (FLONASE ) 50 MCG/ACT nasal spray One spray once a day or as needed for stuffy nose 16 g 5   Fluticasone -Umeclidin-Vilant (TRELEGY ELLIPTA ) 200-62.5-25 MCG/ACT AEPB USE 1 INHALATION DAILY 60 each 1   nicotine  ([employer]S NICOTINE ) 21 mg/24hr patch Place 1 patch (21 mg total) onto the skin daily. 28 patch 2   Paliperidone  Palmitate ER (INVEGA  TRINZA) 546 MG/1.75ML injection Inject 1.75 mLs (546 mg total) into the muscle once for 1 dose. 1 mL 1   propranolol  (INDERAL ) 10 MG tablet Take 1 tablet (10 mg total) by mouth 2 (two) times daily. 180 tablet 0   traZODone  (DESYREL ) 100 MG tablet Take 1 tablet (100 mg total) by mouth at bedtime. 90 tablet 0   Current Facility-Administered Medications  Medication Dose Route Frequency Provider Last Rate Last Admin   Paliperidone  Palmitate ER (INVEGA  TRINZA) injection 546 mg  546 mg Intramuscular Q90 days Carvin Arvella HERO, MD   546 mg at 06/24/24 1337     Musculoskeletal: Strength & Muscle Tone: within normal limits Gait & Station: normal Patient leans: N/A  Psychiatric Specialty Exam: Review of Systems  There were no vitals taken for this visit.There is no height or weight on file to calculate BMI.  General Appearance: Fairly Groomed  Eye Contact:  Fair  Speech:  Normal Rate  Volume:  Decreased  Mood:  Euthymic  Affect:  Congruent  Thought Process:  Coherent and Goal Directed  Orientation:  Full (Time, Place, and Person)  Thought Content: Logical   Suicidal Thoughts:  No  Homicidal Thoughts:  No  Memory:  Immediate;   Fair  Judgement:  Fair  Insight:  Fair  Psychomotor Activity:  Normal  Concentration:  Concentration: Good  Recall:  Good  Fund of Knowledge: Fair  Language: Good  Akathisia:  No    AIMS (if indicated): 0  Assets:  Communication  Skills Desire for Improvement Housing Social  Support  ADL's:  Intact  Cognition: WNL  Sleep:  Good   Metabolic Disorder Labs: No results found for: HGBA1C, MPG No results found for: PROLACTIN Lab Results  Component Value Date   CHOL 175 08/29/2016   TRIG 158 (H) 08/29/2016   HDL 33 (L) 08/29/2016   CHOLHDL 5.3 (H) 08/29/2016   LDLCALC 110 (H) 08/29/2016   Lab Results  Component Value Date   TSH 1.290 03/09/2013   TSH 3.680 12/27/2011    Therapeutic Level Labs: No results found for: LITHIUM No results found for: VALPROATE No results found for: CBMZ   Screenings: AIMS    Flowsheet Row [facility]11[facility] Office Visit from 11/19/2023 in [facility]  AIMS Total Score 0 0   GAD-7    Flowsheet Row Office Visit from 11/18/2022 in [facility]  Total GAD-7 Score 7   PHQ2-9    Flowsheet Row Office Visit from 11/18/2022 in [facility] Office Visit from 01/01/2018 in [facility] Office Visit from 05/23/2017 in [facility] Office Visit from 08/29/2016 in [facility]  PHQ-2 Total Score 2 4 0 0  PHQ-9 Total Score 7 12 -- --   Flowsheet Row Office Visit from 11/18/2022 in [facility] ED from 09/07/2022 in [facility] at [facility]  C-SSRS RISK CATEGORY No Risk No Risk    Collaboration of Care: Collaboration of Care: Medication Management AEB medication prescription and Primary Care Provider AEB chart review  Patient/Guardian was advised Release of Information must be obtained prior to any record release in order to collaborate their care with an outside provider. Patient/Guardian was advised if they have not already done so to contact the registration department to sign all  necessary forms in order for us  to release information regarding their care.   Consent: Patient/Guardian gives verbal consent for treatment and assignment of benefits for services provided during this visit. Patient/Guardian expressed understanding and agreed to proceed.    Arvella CHRISTELLA Finder, MD 08/09/2024, 10:56 AM

## 2024-08-10 ENCOUNTER — Ambulatory Visit (HOSPITAL_COMMUNITY): Admitting: Psychiatry

## 2024-08-24 ENCOUNTER — Ambulatory Visit (HOSPITAL_COMMUNITY): Admitting: Psychiatry

## 2024-09-22 ENCOUNTER — Ambulatory Visit (HOSPITAL_COMMUNITY)
